# Patient Record
Sex: Female | Born: 1960 | Race: Asian | Hispanic: No | Marital: Married | Smoking: Never smoker
Health system: Southern US, Community
[De-identification: ages and names within clinical notes are randomized; demographics above are authoritative.]

## PROBLEM LIST (undated history)

## (undated) DIAGNOSIS — D219 Benign neoplasm of connective and other soft tissue, unspecified: Secondary | ICD-10-CM

## (undated) DIAGNOSIS — B181 Chronic viral hepatitis B without delta-agent: Secondary | ICD-10-CM

## (undated) DIAGNOSIS — I499 Cardiac arrhythmia, unspecified: Secondary | ICD-10-CM

## (undated) HISTORY — DX: Cardiac arrhythmia, unspecified: I49.9

## (undated) HISTORY — DX: Chronic viral hepatitis B without delta-agent: B18.1

## (undated) HISTORY — DX: Benign neoplasm of connective and other soft tissue, unspecified: D21.9

---

## 1997-05-24 ENCOUNTER — Other Ambulatory Visit: Admission: RE | Admit: 1997-05-24 | Discharge: 1997-05-24 | Payer: Self-pay | Admitting: Gynecology

## 1998-01-06 HISTORY — PX: BREAST LUMPECTOMY: SHX2

## 1998-06-05 ENCOUNTER — Other Ambulatory Visit: Admission: RE | Admit: 1998-06-05 | Discharge: 1998-06-05 | Payer: Self-pay | Admitting: Gynecology

## 1999-06-05 ENCOUNTER — Other Ambulatory Visit: Admission: RE | Admit: 1999-06-05 | Discharge: 1999-06-05 | Payer: Self-pay | Admitting: Gynecology

## 2000-06-08 ENCOUNTER — Other Ambulatory Visit: Admission: RE | Admit: 2000-06-08 | Discharge: 2000-06-08 | Payer: Self-pay | Admitting: Gynecology

## 2000-07-17 ENCOUNTER — Ambulatory Visit (HOSPITAL_COMMUNITY): Admission: RE | Admit: 2000-07-17 | Discharge: 2000-07-17 | Payer: Self-pay | Admitting: Gastroenterology

## 2000-07-17 ENCOUNTER — Encounter: Payer: Self-pay | Admitting: Gastroenterology

## 2000-12-17 ENCOUNTER — Other Ambulatory Visit: Admission: RE | Admit: 2000-12-17 | Discharge: 2000-12-17 | Payer: Self-pay | Admitting: Radiology

## 2001-01-06 HISTORY — PX: BREAST EXCISIONAL BIOPSY: SUR124

## 2001-06-14 ENCOUNTER — Other Ambulatory Visit: Admission: RE | Admit: 2001-06-14 | Discharge: 2001-06-14 | Payer: Self-pay | Admitting: Gynecology

## 2002-06-16 ENCOUNTER — Other Ambulatory Visit: Admission: RE | Admit: 2002-06-16 | Discharge: 2002-06-16 | Payer: Self-pay | Admitting: Gynecology

## 2002-07-15 ENCOUNTER — Encounter: Payer: Self-pay | Admitting: Gastroenterology

## 2002-07-15 ENCOUNTER — Encounter: Admission: RE | Admit: 2002-07-15 | Discharge: 2002-07-15 | Payer: Self-pay | Admitting: Gastroenterology

## 2003-06-27 ENCOUNTER — Other Ambulatory Visit: Admission: RE | Admit: 2003-06-27 | Discharge: 2003-06-27 | Payer: Self-pay | Admitting: Gynecology

## 2004-02-23 ENCOUNTER — Ambulatory Visit: Payer: Self-pay | Admitting: Internal Medicine

## 2004-05-02 ENCOUNTER — Ambulatory Visit: Payer: Self-pay | Admitting: Internal Medicine

## 2004-06-04 ENCOUNTER — Ambulatory Visit: Payer: Self-pay | Admitting: Internal Medicine

## 2004-08-06 ENCOUNTER — Other Ambulatory Visit: Admission: RE | Admit: 2004-08-06 | Discharge: 2004-08-06 | Payer: Self-pay | Admitting: Gynecology

## 2004-09-18 ENCOUNTER — Ambulatory Visit: Payer: Self-pay | Admitting: Internal Medicine

## 2004-11-07 ENCOUNTER — Ambulatory Visit: Payer: Self-pay | Admitting: Internal Medicine

## 2005-01-27 ENCOUNTER — Ambulatory Visit: Payer: Self-pay | Admitting: Internal Medicine

## 2005-08-07 ENCOUNTER — Other Ambulatory Visit: Admission: RE | Admit: 2005-08-07 | Discharge: 2005-08-07 | Payer: Self-pay | Admitting: Gynecology

## 2005-12-03 ENCOUNTER — Ambulatory Visit: Payer: Self-pay | Admitting: Internal Medicine

## 2005-12-03 LAB — CONVERTED CEMR LAB
AST: 17 units/L (ref 0–37)
Albumin: 4.5 g/dL (ref 3.5–5.2)
Alkaline Phosphatase: 36 units/L — ABNORMAL LOW (ref 39–117)
BUN: 11 mg/dL (ref 6–23)
Basophils Absolute: 0 10*3/uL (ref 0.0–0.1)
Cortisol, Plasma: 10.2 ug/dL
Creatinine, Ser: 0.8 mg/dL (ref 0.4–1.2)
Eosinophil percent: 1.8 % (ref 0.0–5.0)
GFR calc non Af Amer: 82 mL/min
HCT: 45.4 % (ref 36.0–46.0)
MCHC: 34.4 g/dL (ref 30.0–36.0)
Monocytes Relative: 10.7 % (ref 3.0–11.0)
Neutro Abs: 2.6 10*3/uL (ref 1.4–7.7)
Potassium: 3.8 meq/L (ref 3.5–5.1)
RBC: 4.85 M/uL (ref 3.87–5.11)
RDW: 12.4 % (ref 11.5–14.6)
Sodium: 139 meq/L (ref 135–145)
Total Bilirubin: 1.9 mg/dL — ABNORMAL HIGH (ref 0.3–1.2)
Total Protein: 7.4 g/dL (ref 6.0–8.3)
Vitamin B-12: 310 pg/mL (ref 211–911)

## 2006-02-24 ENCOUNTER — Ambulatory Visit: Payer: Self-pay | Admitting: Internal Medicine

## 2006-06-02 ENCOUNTER — Ambulatory Visit: Payer: Self-pay | Admitting: Internal Medicine

## 2006-06-02 LAB — CONVERTED CEMR LAB: Vit D, 1,25-Dihydroxy: 17 — ABNORMAL LOW (ref 20–57)

## 2006-06-10 ENCOUNTER — Ambulatory Visit: Payer: Self-pay | Admitting: Internal Medicine

## 2006-06-29 ENCOUNTER — Ambulatory Visit: Payer: Self-pay | Admitting: Internal Medicine

## 2006-07-02 ENCOUNTER — Ambulatory Visit: Payer: Self-pay | Admitting: Internal Medicine

## 2006-08-10 ENCOUNTER — Other Ambulatory Visit: Admission: RE | Admit: 2006-08-10 | Discharge: 2006-08-10 | Payer: Self-pay | Admitting: Gynecology

## 2006-08-10 ENCOUNTER — Ambulatory Visit: Payer: Self-pay | Admitting: Internal Medicine

## 2006-10-06 ENCOUNTER — Ambulatory Visit: Payer: Self-pay | Admitting: Internal Medicine

## 2007-03-17 ENCOUNTER — Other Ambulatory Visit: Admission: RE | Admit: 2007-03-17 | Discharge: 2007-03-17 | Payer: Self-pay | Admitting: Gynecology

## 2007-05-20 ENCOUNTER — Ambulatory Visit: Payer: Self-pay | Admitting: Internal Medicine

## 2007-05-20 DIAGNOSIS — E559 Vitamin D deficiency, unspecified: Secondary | ICD-10-CM | POA: Insufficient documentation

## 2007-05-20 DIAGNOSIS — G4700 Insomnia, unspecified: Secondary | ICD-10-CM | POA: Insufficient documentation

## 2007-05-20 DIAGNOSIS — K649 Unspecified hemorrhoids: Secondary | ICD-10-CM | POA: Insufficient documentation

## 2007-05-20 DIAGNOSIS — N760 Acute vaginitis: Secondary | ICD-10-CM | POA: Insufficient documentation

## 2007-05-21 LAB — CONVERTED CEMR LAB

## 2007-08-11 ENCOUNTER — Other Ambulatory Visit: Admission: RE | Admit: 2007-08-11 | Discharge: 2007-08-11 | Payer: Self-pay | Admitting: Gynecology

## 2008-03-06 ENCOUNTER — Ambulatory Visit: Payer: Self-pay | Admitting: Internal Medicine

## 2008-03-06 DIAGNOSIS — R209 Unspecified disturbances of skin sensation: Secondary | ICD-10-CM | POA: Insufficient documentation

## 2008-03-06 DIAGNOSIS — M269 Dentofacial anomaly, unspecified: Secondary | ICD-10-CM | POA: Insufficient documentation

## 2008-03-06 DIAGNOSIS — E538 Deficiency of other specified B group vitamins: Secondary | ICD-10-CM | POA: Insufficient documentation

## 2008-03-06 DIAGNOSIS — R252 Cramp and spasm: Secondary | ICD-10-CM | POA: Insufficient documentation

## 2008-03-06 DIAGNOSIS — R002 Palpitations: Secondary | ICD-10-CM | POA: Insufficient documentation

## 2008-03-06 DIAGNOSIS — R42 Dizziness and giddiness: Secondary | ICD-10-CM | POA: Insufficient documentation

## 2008-03-06 DIAGNOSIS — J45909 Unspecified asthma, uncomplicated: Secondary | ICD-10-CM | POA: Insufficient documentation

## 2008-03-13 ENCOUNTER — Ambulatory Visit: Payer: Self-pay | Admitting: Internal Medicine

## 2008-03-22 ENCOUNTER — Encounter: Payer: Self-pay | Admitting: Internal Medicine

## 2008-03-22 ENCOUNTER — Ambulatory Visit: Payer: Self-pay

## 2008-04-05 ENCOUNTER — Ambulatory Visit: Payer: Self-pay | Admitting: Internal Medicine

## 2008-04-28 ENCOUNTER — Ambulatory Visit: Payer: Self-pay | Admitting: Internal Medicine

## 2008-04-28 DIAGNOSIS — M549 Dorsalgia, unspecified: Secondary | ICD-10-CM | POA: Insufficient documentation

## 2008-04-28 DIAGNOSIS — M545 Low back pain, unspecified: Secondary | ICD-10-CM | POA: Insufficient documentation

## 2008-06-30 ENCOUNTER — Ambulatory Visit: Payer: Self-pay | Admitting: Internal Medicine

## 2008-06-30 LAB — CONVERTED CEMR LAB
BUN: 9 mg/dL (ref 6–23)
Calcium: 9.1 mg/dL (ref 8.4–10.5)
Creatinine, Ser: 0.6 mg/dL (ref 0.4–1.2)
GFR calc non Af Amer: 112.64 mL/min (ref >60)
Glucose, Bld: 117 mg/dL — ABNORMAL HIGH (ref 70–99)
Vit D, 25-Hydroxy: 33 ng/mL (ref 30–89)

## 2008-07-06 ENCOUNTER — Ambulatory Visit: Payer: Self-pay | Admitting: Internal Medicine

## 2008-08-15 ENCOUNTER — Encounter: Payer: Self-pay | Admitting: Gynecology

## 2008-08-15 ENCOUNTER — Ambulatory Visit: Payer: Self-pay | Admitting: Gynecology

## 2008-08-15 ENCOUNTER — Other Ambulatory Visit: Admission: RE | Admit: 2008-08-15 | Discharge: 2008-08-15 | Payer: Self-pay | Admitting: Gynecology

## 2008-09-27 ENCOUNTER — Encounter: Payer: Self-pay | Admitting: Internal Medicine

## 2009-02-15 ENCOUNTER — Encounter: Payer: Self-pay | Admitting: Internal Medicine

## 2009-03-07 ENCOUNTER — Encounter: Payer: Self-pay | Admitting: Internal Medicine

## 2009-03-27 ENCOUNTER — Ambulatory Visit: Payer: Self-pay | Admitting: Internal Medicine

## 2009-03-27 DIAGNOSIS — M25539 Pain in unspecified wrist: Secondary | ICD-10-CM | POA: Insufficient documentation

## 2009-03-27 DIAGNOSIS — R635 Abnormal weight gain: Secondary | ICD-10-CM | POA: Insufficient documentation

## 2009-03-29 LAB — CONVERTED CEMR LAB
BUN: 14 mg/dL (ref 6–23)
Basophils Absolute: 0 10*3/uL (ref 0.0–0.1)
CO2: 28 meq/L (ref 19–32)
Calcium: 8.9 mg/dL (ref 8.4–10.5)
Chloride: 106 meq/L (ref 96–112)
Creatinine, Ser: 0.8 mg/dL (ref 0.4–1.2)
Glucose, Bld: 95 mg/dL (ref 70–99)
HCT: 41.8 % (ref 36.0–46.0)
Hemoglobin: 14.2 g/dL (ref 12.0–15.0)
Lymphs Abs: 1.8 10*3/uL (ref 0.7–4.0)
MCV: 95.4 fL (ref 78.0–100.0)
Monocytes Absolute: 0.5 10*3/uL (ref 0.1–1.0)
Monocytes Relative: 7.8 % (ref 3.0–12.0)
Neutro Abs: 4.2 10*3/uL (ref 1.4–7.7)
Platelets: 252 10*3/uL (ref 150.0–400.0)
RDW: 12.2 % (ref 11.5–14.6)
Vitamin B-12: 217 pg/mL (ref 211–911)

## 2009-05-15 ENCOUNTER — Encounter: Admission: RE | Admit: 2009-05-15 | Discharge: 2009-05-15 | Payer: Self-pay | Admitting: Internal Medicine

## 2009-08-27 ENCOUNTER — Encounter: Payer: Self-pay | Admitting: Internal Medicine

## 2009-08-28 ENCOUNTER — Other Ambulatory Visit: Admission: RE | Admit: 2009-08-28 | Discharge: 2009-08-28 | Payer: Self-pay | Admitting: Gynecology

## 2009-08-28 ENCOUNTER — Ambulatory Visit: Payer: Self-pay | Admitting: Gynecology

## 2009-10-11 ENCOUNTER — Ambulatory Visit: Payer: Self-pay | Admitting: Internal Medicine

## 2009-10-11 DIAGNOSIS — J069 Acute upper respiratory infection, unspecified: Secondary | ICD-10-CM | POA: Insufficient documentation

## 2010-01-31 ENCOUNTER — Other Ambulatory Visit: Payer: Self-pay | Admitting: Internal Medicine

## 2010-01-31 ENCOUNTER — Ambulatory Visit
Admission: RE | Admit: 2010-01-31 | Discharge: 2010-01-31 | Payer: Self-pay | Source: Home / Self Care | Attending: Internal Medicine | Admitting: Internal Medicine

## 2010-01-31 LAB — CBC WITH DIFFERENTIAL/PLATELET
Basophils Absolute: 0 10*3/uL (ref 0.0–0.1)
Basophils Relative: 0.5 % (ref 0.0–3.0)
Eosinophils Absolute: 0.3 10*3/uL (ref 0.0–0.7)
Eosinophils Relative: 5.3 % — ABNORMAL HIGH (ref 0.0–5.0)
HCT: 41.9 % (ref 36.0–46.0)
Hemoglobin: 14.6 g/dL (ref 12.0–15.0)
Lymphocytes Relative: 39 % (ref 12.0–46.0)
Lymphs Abs: 2.5 10*3/uL (ref 0.7–4.0)
MCHC: 34.9 g/dL (ref 30.0–36.0)
MCV: 94.2 fl (ref 78.0–100.0)
Monocytes Absolute: 0.5 10*3/uL (ref 0.1–1.0)
Monocytes Relative: 8.6 % (ref 3.0–12.0)
Neutro Abs: 3 10*3/uL (ref 1.4–7.7)
Neutrophils Relative %: 46.6 % (ref 43.0–77.0)
Platelets: 295 10*3/uL (ref 150.0–400.0)
RBC: 4.45 Mil/uL (ref 3.87–5.11)
RDW: 13.4 % (ref 11.5–14.6)
WBC: 6.4 10*3/uL (ref 4.5–10.5)

## 2010-01-31 LAB — URINALYSIS, ROUTINE W REFLEX MICROSCOPIC
Bilirubin Urine: NEGATIVE
Ketones, ur: NEGATIVE
Leukocytes, UA: NEGATIVE
Nitrite: NEGATIVE
Specific Gravity, Urine: 1.01 (ref 1.000–1.030)
Total Protein, Urine: NEGATIVE
Urine Glucose: NEGATIVE
Urobilinogen, UA: 0.2 (ref 0.0–1.0)
pH: 6.5 (ref 5.0–8.0)

## 2010-01-31 LAB — BASIC METABOLIC PANEL
BUN: 15 mg/dL (ref 6–23)
CO2: 27 mEq/L (ref 19–32)
Calcium: 9.1 mg/dL (ref 8.4–10.5)
Chloride: 100 mEq/L (ref 96–112)
Creatinine, Ser: 0.6 mg/dL (ref 0.4–1.2)
GFR: 111.92 mL/min (ref >60.00)
Glucose, Bld: 94 mg/dL (ref 70–99)
Potassium: 3.7 mEq/L (ref 3.5–5.1)
Sodium: 135 mEq/L (ref 135–145)

## 2010-01-31 LAB — HEPATIC FUNCTION PANEL
ALT: 22 U/L (ref 0–35)
AST: 16 U/L (ref 0–37)
Albumin: 4 g/dL (ref 3.5–5.2)
Alkaline Phosphatase: 38 U/L — ABNORMAL LOW (ref 39–117)
Bilirubin, Direct: 0.1 mg/dL (ref 0.0–0.3)
Total Bilirubin: 1.2 mg/dL (ref 0.3–1.2)
Total Protein: 7.3 g/dL (ref 6.0–8.3)

## 2010-01-31 LAB — LIPID PANEL
Cholesterol: 211 mg/dL — ABNORMAL HIGH (ref 0–200)
HDL: 60.7 mg/dL (ref >39.00)
Total CHOL/HDL Ratio: 3
Triglycerides: 132 mg/dL (ref 0.0–149.0)
VLDL: 26.4 mg/dL (ref 0.0–40.0)

## 2010-01-31 LAB — TSH: TSH: 1.89 u[IU]/mL (ref 0.35–5.50)

## 2010-01-31 LAB — LDL CHOLESTEROL, DIRECT: Direct LDL: 136.6 mg/dL

## 2010-02-03 LAB — CONVERTED CEMR LAB
Albumin: 4.2 g/dL (ref 3.5–5.2)
BUN: 11 mg/dL (ref 6–23)
Basophils Absolute: 0 10*3/uL (ref 0.0–0.1)
Bilirubin Urine: NEGATIVE
Bilirubin, Direct: 0.2 mg/dL (ref 0.0–0.3)
Calcium: 9.2 mg/dL (ref 8.4–10.5)
GFR calc Af Amer: 137 mL/min
Glucose, Bld: 99 mg/dL (ref 70–99)
Leukocytes, UA: NEGATIVE
Lymphocytes Relative: 36.6 % (ref 12.0–46.0)
MCHC: 34.8 g/dL (ref 30.0–36.0)
Magnesium: 2.6 mg/dL — ABNORMAL HIGH (ref 1.5–2.5)
Mucus, UA: NEGATIVE
Neutrophils Relative %: 53.6 % (ref 43.0–77.0)
Nitrite: NEGATIVE
Platelets: 279 10*3/uL (ref 150–400)
RDW: 12.3 % (ref 11.5–14.6)
Sed Rate: 10 mm/hr (ref 0–22)
Sodium: 141 meq/L (ref 135–145)
Specific Gravity, Urine: <=1.005 (ref 1.000–1.035)
TSH: 2.69 microintl units/mL (ref 0.35–5.50)
Total Protein: 7.3 g/dL (ref 6.0–8.3)
pH: 6 (ref 5.0–8.0)

## 2010-02-06 ENCOUNTER — Ambulatory Visit: Admit: 2010-02-06 | Payer: Self-pay | Admitting: Internal Medicine

## 2010-02-06 ENCOUNTER — Encounter: Payer: Self-pay | Admitting: Internal Medicine

## 2010-02-06 ENCOUNTER — Other Ambulatory Visit: Payer: Self-pay | Admitting: Internal Medicine

## 2010-02-06 ENCOUNTER — Encounter (INDEPENDENT_AMBULATORY_CARE_PROVIDER_SITE_OTHER): Payer: BC Managed Care – PPO | Admitting: Internal Medicine

## 2010-02-06 DIAGNOSIS — E559 Vitamin D deficiency, unspecified: Secondary | ICD-10-CM

## 2010-02-06 DIAGNOSIS — R21 Rash and other nonspecific skin eruption: Secondary | ICD-10-CM | POA: Insufficient documentation

## 2010-02-06 DIAGNOSIS — D485 Neoplasm of uncertain behavior of skin: Secondary | ICD-10-CM

## 2010-02-06 DIAGNOSIS — E538 Deficiency of other specified B group vitamins: Secondary | ICD-10-CM

## 2010-02-06 DIAGNOSIS — K5900 Constipation, unspecified: Secondary | ICD-10-CM | POA: Insufficient documentation

## 2010-02-06 DIAGNOSIS — K5909 Other constipation: Secondary | ICD-10-CM

## 2010-02-07 NOTE — Consult Note (Signed)
Summary: [facility] [place]   Imported By: Sherian Rein 09/04/2009 13:36:00  _____________________________________________________________________  External Attachment:    Type:   Image     Comment:   External Document

## 2010-02-07 NOTE — Miscellaneous (Signed)
[facility]   Imported By: Lester Butler 04/05/2009 11:58:21  _____________________________________________________________________  External Attachment:    Type:   Image     Comment:   External Document

## 2010-02-07 NOTE — Procedures (Signed)
Summary: Colonoscopy/[facility]  Colonoscopy/[facility]   Imported By: Sherian Rein 03/28/2009 07:55:37  _____________________________________________________________________  External Attachment:    Type:   Image     Comment:   External Document

## 2010-02-07 NOTE — Assessment & Plan Note (Signed)
Summary: REFILLS/ WRIST PAIN/NWS   Vital Signs:  Patient profile:   50 year old female Weight:      126 pounds BMI:     23.89 Temp:     97.8 degrees F oral Pulse rate:   84 / minute BP sitting:   100 / 70  (left arm)  Vitals Entered By: Tora Perches (March 27, 2009 1:47 PM) CC: pt c/o of  right wrist pain and refills/vg Is Patient Diabetic? No   Primary Care Provider:  Georgina Quint Plotnikov MD  CC:  pt c/o of  right wrist pain and refills/vg.  History of Present Illness: The patient presents for a follow up of back anxiety, constipation and headaches. C/o wt gain. C/o R bad wrist pain x 2 wks after moving luggage C/o wt gain 10 lbs C/o a lot of stress C/o nausea - gets plane sick   Preventive Screening-Counseling & Management  Alcohol-Tobacco     Smoking Status: never  Current Medications (verified): 1)  Lutera 0.1-20 Mg-Mcg  Tabs (Levonorgestrel-Ethinyl Estrad) .... Take 1 Tablet By Mouth Once A Day 2)  Miralax   Powd (Polyethylene Glycol 3350) .Marland Kitchen.. 17g By Mouth Once Daily As Needed Constipation 3)  Lorazepam 0.5 Mg Tabs (Lorazepam) .Marland Kitchen.. 1 By Mouth Two Times A Day As Needed Anxiety/insomnia 4)  Promethazine Hcl 25 Mg  Tabs (Promethazine Hcl) .Marland Kitchen.. 1 By Mouth Qid As Needed Nausea 5)  Meclizine Hcl 12.5 Mg Tabs (Meclizine Hcl) .Marland Kitchen.. 1 By Mouth Qid As Needed Vertigo 6)  Vitamin D3 1000 Unit  Tabs (Cholecalciferol) .... 2 By Mouth Daily 7)  Advair Diskus 100-50 Mcg/dose Misc (Fluticasone-Salmeterol) .Marland Kitchen.. 1 Puff 2 Times Daily 8)  Valtrex 500 Mg Tabs (Valacyclovir Hcl) .Marland Kitchen.. 1 By Mouth Two Times A Day As Needed Cold Sores X 5 D 9)  Vitamin B-12 Cr 1000 Mcg  Tbcr (Cyanocobalamin) .... Take One Tablet By Mouth Daily 10)  Propranolol Hcl 10 Mg Tabs (Propranolol Hcl) .Marland Kitchen.. 1 By Mouth Two Times A Day As Needed Palpitations 11)  Potassium Chloride Cr 10 Meq  Tbcr (Potassium Chloride) .Marland Kitchen.. 1 By Mouth Once Daily 12)  Triamcinolone Acetonide 0.5 % Crea (Triamcinolone Acetonide) .... Apply  Bid To Affected Area 13)  Align  Caps (Probiotic Product) .... Once Daily 14)  Stool Softener 100 Mg Caps (Docusate Sodium) .... Once Daily  Allergies: 1)  ! * Alcohol 2)  ! Avelox 3)  ! Doxycycline 4)  ! Sulfa 5)  ! Cipro  Past History:  Past Medical History: Vit D def Insomnia BPV Asthma L TMJ 2010  Cold sores 2010 Palpitations 2010 Low vit B12 2010 MVP on ECHO 2010 GI Dr [address] Colon 2011  Past Surgical History: Denies surgical history  Social History: Married, 1 son Kenard Gower Husband had a brain tumor which reccured in 2010 Never Smoked Regular exercise-no  Review of Systems       The patient complains of weight gain and depression.  The patient denies chest pain, dyspnea on exertion, prolonged cough, and abdominal pain.         constipation, fatigue  Physical Exam  General:  NADwell-developed and well-nourished.   Head:  Normocephalic and atraumatic without obvious abnormalities. No apparent alopecia or balding. Eyes:  No corneal or conjunctival inflammation noted. EOMI. Perrla Ears:  External ear exam shows no significant lesions or deformities.  Otoscopic examination reveals clear canals, tympanic membranes are intact bilaterally without bulging, retraction, inflammation or discharge. Hearing is grossly normal bilaterally. Nose:  External  nasal examination shows no deformity or inflammation. Nasal mucosa are pink and moist without lesions or exudates. Mouth:  Oral mucosa and oropharynx without lesions or exudates.  Teeth in good repair. Neck:  No deformities, masses, or tenderness noted. Lungs:  Normal respiratory effort, chest expands symmetrically. Lungs are clear to auscultation, no crackles or wheezes. Heart:  Normal rate and regular rhythm. S1 and S2 normal without gallop, murmur, click, rub or other extra sounds. Abdomen:  Bowel sounds positive,abdomen soft and non-tender without masses, organomegaly or hernias noted. Msk:  R lat wrist is painful Pulses:  R  and L carotid,radial,femoral,dorsalis pedis and posterior tibial pulses are full and equal bilaterally Extremities:  No clubbing, cyanosis, edema, or deformity noted with normal full range of motion of all joints.   Neurologic:  No cranial nerve deficits noted. Station and gait are normal. Plantar reflexes are down-going bilaterally. DTRs are symmetrical throughout. Sensory, motor and coordinative functions appear intact. Skin:  Intact without suspicious lesions or rashes Cervical Nodes:  No lymphadenopathy noted Inguinal Nodes:  No significant adenopathy Psych:  Oriented X3, memory intact for recent and remote, normally interactive, good eye contact, not anxious appearing, and not depressed appearing.     Impression & Recommendations:  Problem # 1:  B12 DEFICIENCY (ICD-266.2) Assessment Unchanged  Orders: TLB-BMP (Basic Metabolic Panel-BMET) (80048-METABOL) TLB-TSH (Thyroid Stimulating Hormone) (84443-TSH) TLB-B12, Serum-Total ONLY (44034-V42) TLB-CBC Platelet - w/Differential (85025-CBCD)  Problem # 2:  WEIGHT GAIN, ABNORMAL (ICD-783.1) - I don't see it as a problem Assessment: New  Orders: TLB-BMP (Basic Metabolic Panel-BMET) (80048-METABOL) TLB-TSH (Thyroid Stimulating Hormone) (84443-TSH) TLB-B12, Serum-Total ONLY (59563-O75) TLB-CBC Platelet - w/Differential (85025-CBCD) The office visit took longer than 45 min with patient councelling for more than 50% of the 45 min  Problem # 3:  INSOMNIA, PERSISTENT (ICD-307.42)  Problem # 4:  CRAMPS,LEG (ICD-729.82) Assessment: Unchanged  Orders: TLB-BMP (Basic Metabolic Panel-BMET) (80048-METABOL) TLB-TSH (Thyroid Stimulating Hormone) (84443-TSH) TLB-B12, Serum-Total ONLY (64332-R51) TLB-CBC Platelet - w/Differential (85025-CBCD)  Problem # 5:  WRIST PAIN (ICD-719.43) R  Complete Medication List: 1)  Lutera 0.1-20 Mg-mcg Tabs (Levonorgestrel-ethinyl estrad) .... Take 1 tablet by mouth once a day 2)  Miralax Powd (Polyethylene  glycol 3350) .Marland Kitchen.. 17g by mouth once daily as needed constipation 3)  Promethazine Hcl 25 Mg Tabs (Promethazine hcl) .Marland Kitchen.. 1 by mouth qid as needed nausea 4)  Meclizine Hcl 12.5 Mg Tabs (Meclizine hcl) .Marland Kitchen.. 1 by mouth qid as needed vertigo 5)  Vitamin D3 1000 Unit Tabs (Cholecalciferol) .... 2 by mouth daily 6)  Advair Diskus 100-50 Mcg/dose Misc (Fluticasone-salmeterol) .Marland Kitchen.. 1 puff 2 times daily 7)  Valtrex 500 Mg Tabs (Valacyclovir hcl) .Marland Kitchen.. 1 by mouth two times a day as needed cold sores x 5 d 8)  Propranolol Hcl 10 Mg Tabs (Propranolol hcl) .Marland Kitchen.. 1 by mouth two times a day as needed palpitations 9)  Potassium Chloride Cr 10 Meq Tbcr (Potassium chloride) .Marland Kitchen.. 1 by mouth once daily 10)  Triamcinolone Acetonide 0.5 % Crea (Triamcinolone acetonide) .... Apply bid to affected area 11)  Align Caps (Probiotic product) .... Once daily 12)  Stool Softener 100 Mg Caps (Docusate sodium) .... Once daily 13)  Zolpidem Tartrate 5 Mg Tabs (Zolpidem tartrate) .Marland Kitchen.. 1-2 by mouth at bedtime as needed insomnia 14)  Amitiza 24 Mcg Caps (Lubiprostone) .Marland Kitchen.. 1-2  by mouth once daily as needed constipation 15)  Zithromax Z-pak 250 Mg Tabs (Azithromycin) .... As dirrected 16)  Pennsaid 1.5 % Soln (Diclofenac sodium) .... 3-5 gtt  on skin three times a day for pain 17)  Nasacort Aq 55 Mcg/act Aers (Triamcinolone acetonide(nasal)) .Marland Kitchen.. 1 spray in each nostril daily for rhinitis 18)  Nascobal 500 Mcg/0.80ml Soln (Cyanocobalamin) .Marland Kitchen.. 1 spr in 1 nostril q 1 wk for vit b12 deficiency  Patient Instructions: 1)  Please schedule a follow-up appointment in 4 months. Prescriptions: NASCOBAL 500 MCG/0.1ML SOLN (CYANOCOBALAMIN) 1 spr in 1 nostril q 1 wk for Vit B12 deficiency  #1 x 0   Entered and Authorized by:   Tresa Garter MD   Signed by:   Tresa Garter MD on 03/29/2009   Method used:   Print then Give to Patient   RxID:   [id number] NASACORT AQ 55 MCG/ACT AERS (TRIAMCINOLONE ACETONIDE(NASAL)) 1 spray in  each nostril daily for rhinitis  #1 x 12   Entered and Authorized by:   Tresa Garter MD   Signed by:   Tresa Garter MD on 03/29/2009   Method used:   Print then Give to Patient   RxID:   [id number] PENNSAID 1.5 % SOLN (DICLOFENAC SODIUM) 3-5 gtt on skin three times a day for pain  #1 x 3   Entered and Authorized by:   Tresa Garter MD   Signed by:   Tresa Garter MD on 03/27/2009   Method used:   Print then Give to Patient   RxID:   [id number] ZITHROMAX Z-PAK 250 MG TABS (AZITHROMYCIN) as dirrected  #1 x 0   Entered and Authorized by:   Tresa Garter MD   Signed by:   Tresa Garter MD on 03/27/2009   Method used:   Print then Give to Patient   RxID:   [id number] TRIAMCINOLONE ACETONIDE 0.5 % CREA (TRIAMCINOLONE ACETONIDE) apply bid to affected area  #120 g x 3   Entered and Authorized by:   Tresa Garter MD   Signed by:   Tresa Garter MD on 03/27/2009   Method used:   Print then Give to Patient   RxID:   [id number] VALTREX 500 MG TABS (VALACYCLOVIR HCL) 1 by mouth two times a day as needed cold sores x 5 d  #20 x 3   Entered and Authorized by:   Tresa Garter MD   Signed by:   Tresa Garter MD on 03/27/2009   Method used:   Print then Give to Patient   RxID:   [id number] PROMETHAZINE HCL 25 MG  TABS (PROMETHAZINE HCL) 1 by mouth qid as needed nausea  #60 x 3   Entered and Authorized by:   Tresa Garter MD   Signed by:   Tresa Garter MD on 03/27/2009   Method used:   Print then Give to Patient   RxID:   [id number] AMITIZA 24 MCG CAPS (LUBIPROSTONE) 1-2  by mouth once daily as needed constipation  #60 x 12   Entered and Authorized by:   Tresa Garter MD   Signed by:   Tresa Garter MD on 03/27/2009   Method used:   Print then Give to Patient   RxID:   [id number] ZOLPIDEM TARTRATE 5 MG TABS (ZOLPIDEM TARTRATE) 1-2 by mouth at bedtime as needed insomnia  #60  x 6   Entered and Authorized by:   Tresa Garter MD   Signed by:   Tresa Garter MD on 03/27/2009   Method used:   Print then Give to Patient   RxID:   [id number]

## 2010-02-07 NOTE — Assessment & Plan Note (Signed)
Summary: spinal adj/dt   Vital Signs:  Patient profile:   50 year old female Height:      61 inches Weight:      125 pounds BMI:     23.70 O2 Sat:      100 % on Room air Temp:     97.4 degrees F oral Pulse rate:   76 / minute Pulse rhythm:   regular Resp:     16 per minute BP sitting:   100 / 70  (right arm) Cuff size:   regular  Vitals Entered By: Glendell Docker CMA (October 11, 2009 3:01 PM)  O2 Flow:  Room air  Contraindications/Deferment of Procedures/Staging:    Test/Procedure: FLU VAX    Reason for deferment: patient declined  CC: OMT Is Patient Diabetic? No Pain Assessment Patient in pain? no      Comments c/o mild throat irritation, leaving for [place] on Monday for 8 days   Primary Care Provider:  Georgina Quint Plotnikov MD  CC:  OMT.  History of Present Illness: 50 y/o [place] female c/o chronic back pain still traveling overseas spends time also working on computer no severe symptoms  1 week - throat is irritated left sided sinus pressure  Preventive Screening-Counseling & Management  Alcohol-Tobacco     Smoking Status: never  Allergies: 1)  ! * Alcohol 2)  ! Avelox 3)  ! Doxycycline 4)  ! Sulfa 5)  ! Cipro  Past History:  Past Medical History: Vit D def Insomnia BPV  Asthma L TMJ 2010  Cold sores 2010 Palpitations 2010 Low vit B12 2010 MVP on ECHO 2010 GI Dr [address] Colon 2011  Past Surgical History: Denies surgical history    Family History: Brother with cerebral hemorrhage at 35 yo  No family hx of polycystic kidney dz   Social History: Married, 1 son Kenard Gower Husband had a brain tumor which reccured in 2010 Never Smoked Regular exercise-no    Review of Systems       some wt gain.  prev thyroid testing in March normal.  Physical Exam  General:  alert, well-developed, and well-nourished.   Lungs:  normal respiratory effort and normal breath sounds.   Heart:  normal rate, regular rhythm, and no gallop.   Msk:  increased  muscle tone in thoracic and c spine    Impression & Recommendations:  Problem # 1:  BACK PAIN, CHRONIC, INTERMITTENT (ICD-724.5)  woke up with upper back pain and neck stiffness.  she has been working on her computer more than usual utilized OMT to lumbar, thoracic and c spine.  she tolerated well use muscle relaxer as needed.  do not take with ambien  Her updated medication list for this problem includes:    Cyclobenzaprine Hcl 5 Mg Tabs (Cyclobenzaprine hcl) ..... One by mouth once daily as needed  Orders: OMT 1-2 [facility] [phone])  Problem # 2:  URI (ICD-465.9) probable viral cause. she will be traveling to [place] on business. we discussed symptoms of bacterial sinusitis use ceftin if needed  Her updated medication list for this problem includes:    Promethazine Hcl 25 Mg Tabs (Promethazine hcl) .Marland Kitchen... 1 by mouth qid as needed nausea  Complete Medication List: 1)  Lutera 0.1-20 Mg-mcg Tabs (Levonorgestrel-ethinyl estrad) .... Take 1 tablet by mouth once a day 2)  Miralax Powd (Polyethylene glycol 3350) .Marland Kitchen.. 17g by mouth once daily as needed constipation 3)  Promethazine Hcl 25 Mg Tabs (Promethazine hcl) .Marland Kitchen.. 1 by mouth qid  as needed nausea 4)  Meclizine Hcl 12.5 Mg Tabs (Meclizine hcl) .Marland Kitchen.. 1 by mouth qid as needed vertigo 5)  Vitamin D3 1000 Unit Tabs (Cholecalciferol) .... 2 by mouth daily 6)  Advair Diskus 100-50 Mcg/dose Misc (Fluticasone-salmeterol) .Marland Kitchen.. 1 puff 2 times daily 7)  Valtrex 500 Mg Tabs (Valacyclovir hcl) .Marland Kitchen.. 1 by mouth two times a day as needed cold sores x 5 d 8)  Propranolol Hcl 10 Mg Tabs (Propranolol hcl) .Marland Kitchen.. 1 by mouth two times a day as needed palpitations 9)  Potassium Chloride Cr 10 Meq Tbcr (Potassium chloride) .Marland Kitchen.. 1 by mouth once daily 10)  Triamcinolone Acetonide 0.5 % Crea (Triamcinolone acetonide) .... Apply bid to affected area 11)  Zolpidem Tartrate 5 Mg Tabs (Zolpidem tartrate) .Marland Kitchen.. 1-2 by mouth at bedtime as needed insomnia 12)   Cefuroxime Axetil 250 Mg Tabs (Cefuroxime axetil) .... One tab by mouth two times a day 13)  Cyclobenzaprine Hcl 5 Mg Tabs (Cyclobenzaprine hcl) .... One by mouth once daily as needed  Patient Instructions: 1)  Call our office if your symptoms do not  improve or gets worse. Prescriptions: CYCLOBENZAPRINE HCL 5 MG TABS (CYCLOBENZAPRINE HCL) one by mouth once daily as needed  #15 x 0   Entered and Authorized by:   D. Thomos Lemons DO   Signed by:   D. Thomos Lemons DO on 10/11/2009   Method used:   Electronically to        [id number] (retail)       [address]       [place], [place]  [address]       Ph: [phone] or [phone]       Fax: [phone]   RxID:   [id number] CEFUROXIME AXETIL 250 MG TABS (CEFUROXIME AXETIL) one tab by mouth two times a day  #14 x 0   Entered and Authorized by:   D. Thomos Lemons DO   Signed by:   D. Thomos Lemons DO on 10/11/2009   Method used:   Print then Give to Patient   RxID:   [id number]    Immunization History:  Tetanus/Td Immunization History:    Tetanus/Td:  historical (09/10/2004)  Influenza Immunization History:    Influenza:  declined (10/11/2009)    Preventive Care Screening  Last Flu Shot:    Date:  10/11/2009    Results:  Declined  Pap Smear:    Date:  08/20/2009    Results:  normal   Colonoscopy:    Date:  03/20/2009    Results:  normal   Mammogram:    Date:  02/20/2009    Results:  normal   Last Tetanus Booster:    Date:  09/10/2004    Results:  Historical    Current Allergies (reviewed today): ! * ALCOHOL ! AVELOX ! DOXYCYCLINE ! SULFA ! CIPRO

## 2010-02-13 NOTE — Assessment & Plan Note (Signed)
Summary: CPX  BCBS  #  --CD   Vital Signs:  Patient profile:   50 year old female Height:      61 inches Weight:      128 pounds BMI:     24.27 Temp:     97.6 degrees F oral Pulse rate:   72 / minute Pulse rhythm:   regular Resp:     16 per minute BP sitting:   112 / 80  (left arm) Cuff size:   regular  Vitals Entered By: Lanier Prude, CMA(AAMA) (February 06, 2010 10:55 AM) CC: CPX Is Patient Diabetic? No Comments pt needs referral for cyst/tag on eyelid. She is not taking meclizine, pot chloride, cefuroxime or cyclobenazeprine   Primary Care Provider:  Tresa Garter MD  CC:  CPX.  History of Present Illness: C/o lump on L eyelid, it is bothering her C/o fatigue C/o constipation C/o rash on B arms x 2 d  Current Medications (verified): 1)  Lutera 0.1-20 Mg-Mcg  Tabs (Levonorgestrel-Ethinyl Estrad) .... Take 1 Tablet By Mouth Once A Day 2)  Miralax   Powd (Polyethylene Glycol 3350) .Marland Kitchen.. 17g By Mouth Once Daily As Needed Constipation 3)  Promethazine Hcl 25 Mg  Tabs (Promethazine Hcl) .Marland Kitchen.. 1 By Mouth Qid As Needed Nausea 4)  Meclizine Hcl 12.5 Mg Tabs (Meclizine Hcl) .Marland Kitchen.. 1 By Mouth Qid As Needed Vertigo 5)  Vitamin D3 1000 Unit  Tabs (Cholecalciferol) .... 2 By Mouth Daily 6)  Advair Diskus 100-50 Mcg/dose Misc (Fluticasone-Salmeterol) .Marland Kitchen.. 1 Puff 2 Times Daily 7)  Valtrex 500 Mg Tabs (Valacyclovir Hcl) .Marland Kitchen.. 1 By Mouth Two Times A Day As Needed Cold Sores X 5 D 8)  Propranolol Hcl 10 Mg Tabs (Propranolol Hcl) .Marland Kitchen.. 1 By Mouth Two Times A Day As Needed Palpitations 9)  Potassium Chloride Cr 10 Meq  Tbcr (Potassium Chloride) .Marland Kitchen.. 1 By Mouth Once Daily 10)  Triamcinolone Acetonide 0.5 % Crea (Triamcinolone Acetonide) .... Apply Bid To Affected Area 11)  Zolpidem Tartrate 5 Mg Tabs (Zolpidem Tartrate) .Marland Kitchen.. 1-2 By Mouth At Bedtime As Needed Insomnia 12)  Cefuroxime Axetil 250 Mg Tabs (Cefuroxime Axetil) .... One Tab By Mouth Two Times A Day 13)  Cyclobenzaprine Hcl 5 Mg  Tabs (Cyclobenzaprine Hcl) .... One By Mouth Once Daily As Needed  Allergies (verified): 1)  ! * Alcohol 2)  ! Avelox 3)  ! Doxycycline 4)  ! Sulfa 5)  ! Cipro  Past History:  Past Medical History: Last updated: 10/11/2009 Vit D def Insomnia BPV  Asthma L TMJ 2010  Cold sores 2010 Palpitations 2010 Low vit B12 2010 MVP on ECHO 2010 GI Dr [address] Colon 2011  Social History: Last updated: 10/11/2009 Married, 1 son Kenard Gower Husband had a brain tumor which reccured in 2010 Never Smoked Regular exercise-no    Review of Systems       The patient complains of weight gain.  The patient denies fever, chest pain, dyspnea on exertion, abdominal pain, and depression.         stress  Physical Exam  General:  alert, well-developed, and well-nourished.   Head:  Normocephalic and atraumatic without obvious abnormalities. No apparent alopecia or balding. Eyes:  4 mm growth on R upper eyelid Ears:  External ear exam shows no significant lesions or deformities.  Otoscopic examination reveals clear canals, tympanic membranes are intact bilaterally without bulging, retraction, inflammation or discharge. Hearing is grossly normal bilaterally. Nose:  External nasal examination shows no deformity or inflammation.  Nasal mucosa are pink and moist without lesions or exudates. Mouth:  Oral mucosa and oropharynx without lesions or exudates.  Teeth in good repair. Lungs:  normal respiratory effort and normal breath sounds.   Heart:  normal rate, regular rhythm, and no gallop.   Abdomen:  Bowel sounds positive,abdomen soft and non-tender without masses, organomegaly or hernias noted. Msk:  increased muscle tone in thoracic and c spine  Extremities:  No clubbing, cyanosis, edema, or deformity noted with normal full range of motion of all joints.   Neurologic:  No cranial nerve deficits noted. Station and gait are normal. Plantar reflexes are down-going bilaterally. DTRs are symmetrical throughout.  Sensory, motor and coordinative functions appear intact. Skin:  Intact without suspicious lesions or rashes Psych:  Oriented X3, memory intact for recent and remote, normally interactive, good eye contact, not anxious appearing, and not depressed appearing.     Impression & Recommendations:  Problem # 1:  STRESS DISORDER (ICD-V62.89) Assessment Unchanged Discussed. She is not interested in meds, councelling etc  Problem # 2:  B12 DEFICIENCY (ICD-266.2) Assessment: Improved  On the regimen of medicine(s) reflected in the chart    Orders: TLB-B12, Serum-Total ONLY (04540-J81)  Problem # 3:  SKIN RASH (ICD-782.1) arms  Her updated medication list for this problem includes:    Triamcinolone Acetonide 0.5 % Crea (Triamcinolone acetonide) .Marland Kitchen... Apply bid to affected area  Problem # 4:  NEOPLASM OF UNCERTAIN BEHAVIOR OF SKIN (ICD-238.2) R upper eyelid Assessment: Deteriorated  Orders: Ophthalmology Referral (Ophthalmology)  Problem # 5:  CONSTIPATION, CHRONIC (ICD-564.09) Assessment: Unchanged Amitiza 2 a day for travel Her updated medication list for this problem includes:    Miralax Powd (Polyethylene glycol 3350) .Marland KitchenMarland KitchenMarland KitchenMarland Kitchen 17g by mouth once daily as needed constipation  Complete Medication List: 1)  Lutera 0.1-20 Mg-mcg Tabs (Levonorgestrel-ethinyl estrad) .... Take 1 tablet by mouth once a day 2)  Miralax Powd (Polyethylene glycol 3350) .Marland Kitchen.. 17g by mouth once daily as needed constipation 3)  Promethazine Hcl 25 Mg Tabs (Promethazine hcl) .Marland Kitchen.. 1 by mouth qid as needed nausea 4)  Meclizine Hcl 12.5 Mg Tabs (Meclizine hcl) .Marland Kitchen.. 1 by mouth qid as needed vertigo 5)  Vitamin D3 1000 Unit Tabs (Cholecalciferol) .... 2 by mouth daily 6)  Advair Diskus 100-50 Mcg/dose Misc (Fluticasone-salmeterol) .Marland Kitchen.. 1 puff 2 times daily 7)  Valtrex 500 Mg Tabs (Valacyclovir hcl) .Marland Kitchen.. 1 by mouth two times a day as needed cold sores x 5 d 8)  Potassium Chloride Cr 10 Meq Tbcr (Potassium chloride) .Marland Kitchen.. 1  by mouth once daily 9)  Triamcinolone Acetonide 0.5 % Crea (Triamcinolone acetonide) .... Apply bid to affected area 10)  Zolpidem Tartrate 5 Mg Tabs (Zolpidem tartrate) .Marland Kitchen.. 1-2 by mouth at bedtime as needed insomnia 11)  Cyclobenzaprine Hcl 5 Mg Tabs (Cyclobenzaprine hcl) .... One by mouth once daily as needed 12)  Amitiza 24 Mcg Caps (Lubiprostone) .Marland Kitchen.. 1 by mouth once or twice daily as needed constipation 13)  Metronidazole 0.75 % Lotn (Metronidazole) .... On face qhs 14)  Lopressor 50 Mg Tabs (Metoprolol tartrate) .Marland Kitchen.. 1 by mouth once daily or two times a day as needed palpitations 15)  Zithromax Z-pak 250 Mg Tabs (Azithromycin) .... As dirrected  Other Orders: T-Vitamin D (25-Hydroxy) [id number])  Patient Instructions: 1)  Please schedule a follow-up appointment in March Prescriptions: AMITIZA 24 MCG CAPS (LUBIPROSTONE) 1 by mouth once or twice daily as needed constipation  #60 x 0   Entered and Authorized by:   Macarthur Critchley  Buckner Malta MD   Signed by:   Tresa Garter MD on 02/07/2010   Method used:   Print then Give to Patient   RxID:   [id number] ZITHROMAX Z-PAK 250 MG TABS (AZITHROMYCIN) as dirrected  #1 x 0   Entered and Authorized by:   Tresa Garter MD   Signed by:   Tresa Garter MD on 02/06/2010   Method used:   Print then Give to Patient   RxID:   [id number] MIRALAX   POWD (POLYETHYLENE GLYCOL 3350) 17g by mouth once daily as needed constipation  #527 Gram x 11   Entered and Authorized by:   Tresa Garter MD   Signed by:   Tresa Garter MD on 02/06/2010   Method used:   Print then Give to Patient   RxID:   [id number] PROMETHAZINE HCL 25 MG  TABS (PROMETHAZINE HCL) 1 by mouth qid as needed nausea  #60 x 3   Entered and Authorized by:   Tresa Garter MD   Signed by:   Tresa Garter MD on 02/06/2010   Method used:   Print then Give to Patient   RxID:   [id number] VALTREX 500 MG TABS (VALACYCLOVIR HCL) 1 by  mouth two times a day as needed cold sores x 5 d  #20 x 3   Entered and Authorized by:   Tresa Garter MD   Signed by:   Tresa Garter MD on 02/06/2010   Method used:   Print then Give to Patient   RxID:   [id number] ZOLPIDEM TARTRATE 5 MG TABS (ZOLPIDEM TARTRATE) 1-2 by mouth at bedtime as needed insomnia  #60 x 6   Entered and Authorized by:   Tresa Garter MD   Signed by:   Tresa Garter MD on 02/06/2010   Method used:   Print then Give to Patient   RxID:   [id number] LOPRESSOR 50 MG TABS (METOPROLOL TARTRATE) 1 by mouth once daily or two times a day as needed palpitations  #60 x 6   Entered and Authorized by:   Tresa Garter MD   Signed by:   Tresa Garter MD on 02/06/2010   Method used:   Print then Give to Patient   RxID:   [id number] TRIAMCINOLONE ACETONIDE 0.5 % CREA (TRIAMCINOLONE ACETONIDE) apply bid to affected area  #120 g x 3   Entered and Authorized by:   Tresa Garter MD   Signed by:   Tresa Garter MD on 02/06/2010   Method used:   Print then Give to Patient   RxID:   [id number] METRONIDAZOLE 0.75 % LOTN (METRONIDAZOLE) on face qhs  #90 g x 6   Entered and Authorized by:   Tresa Garter MD   Signed by:   Tresa Garter MD on 02/06/2010   Method used:   Print then Give to Patient   RxID:   [id number] AMITIZA 24 MCG CAPS (LUBIPROSTONE) 1 by mouth once or twice daily as needed constipation  #30 x 12   Entered and Authorized by:   Tresa Garter MD   Signed by:   Tresa Garter MD on 02/06/2010   Method used:   Print then Give to Patient   RxID:   [id number]    Orders Added: 1)  TLB-B12, Serum-Total ONLY [10175-Z02] 2)  T-Vitamin D (25-Hydroxy) [58527-78242] 3)  Est. Patient Level IV [35361] 4)  Ophthalmology Referral [Ophthalmology]

## 2010-02-14 ENCOUNTER — Telehealth: Payer: Self-pay | Admitting: Internal Medicine

## 2010-02-15 LAB — CONVERTED CEMR LAB: Vit D, 25-Hydroxy: 23 ng/mL — ABNORMAL LOW (ref 30–89)

## 2010-02-21 NOTE — Progress Notes (Signed)
Summary: lab results  Phone Note Call from Patient   Caller: Patient Summary of Call: Patient called [facility] requesting lab results.Alvy Beal Archie CMA  February 14, 2010 9:25 AM    Follow-up for Phone Call        pt informed  Follow-up by: Lanier Prude, [facility]),  February 15, 2010 2:26 PM

## 2010-05-21 NOTE — Letter (Signed)
March 13, 2008    Amber Quint. Plotnikov, MD  [address]  [place], [place] [address]   RE:  Amber Lowe  MRN:  [id number]  /  DOB:  05-May-1960   Dear Dr. Posey Rea:   It was my pleasure to see Amber Lowe in electrophysiology consultation  today regarding her palpitations.  As you recall, Dr. Scorsone is a pleasant  50 year old female with a history of hepatitis B and vertigo who was  recently evaluated in your office for palpitations.  She notes that on  February 27, 2008, she developed a chest pounding, which lasted  several hours.  She did check her pulse and noted it to be 92 beats per  minute.  She also reports taking her blood pressure and found it to be  100/67 mmHg.  She notes that she had had cold sores around the same time  and had been taking a new vitamin B complex pill, which advertises  increased energy provided.  She took this pill for 4 days and had  similar palpitations and chest pounding for this 4 days.  Upon cessation  of this medication, she has had no further chest pounding.  She notes  that 2-3 days ago, she took 1 additional vitamin B complex pill and  again had several hours of chest pounding.  She denies chest discomfort.  She denies chest pain or tightness, shortness of breath, orthopnea, PND,  lower extremity edema, presyncope, or syncope.  She is rather healthy  and walks frequently 45 minutes without any symptoms of angina or heart  failure.  She is otherwise without complaint today.   PAST MEDICAL HISTORY:  1. Hepatitis B.  2. Vertigo.  3. Childhood asthma, now resolved.   PAST SURGICAL HISTORY:  None.   ALLERGIES:  ALCOHOL causes rash.   CURRENT MEDICATIONS:  1. Vitamin B12 daily.  2. Vitamin D daily.  3. Birth control pills daily.   SOCIAL HISTORY:  The patient lives in [place] with her spouse.  She  has a Ph.D. in Cytogeneticist from [employer]  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  She has a son who is 15.  She denies tobacco, alcohol, or drug  use.   FAMILY HISTORY:  The patient's brother died with cerebral aneurysm  rupture.  She denies family history of coronary artery disease, sudden  cardiac death, or arrhythmias.   REVIEW OF SYSTEMS:  All systems were reviewed and negative except as  outlined in the HPI above.   PHYSICAL EXAMINATION:  VITAL SIGNS:  Blood pressure 136/77, heart rate  90, respirations 18, weight 117 pounds.  GENERAL:  The patient is a thin female in no acute distress.  She is  alert and oriented x3.  HEENT:  Normocephalic, atraumatic.  Sclerae are clear.  Conjunctivae are  pink.  Oropharynx is clear.  NECK:  Supple.  No JVD, thyromegaly, or bruits.  LUNGS:  Clear to auscultation bilaterally.  HEART:  Regular rate and rhythm.  No murmurs, rubs, or gallops.  GI:  Soft, nontender, nondistended.  Positive bowel sounds.  EXTREMITIES:  No clubbing, cyanosis, or edema.  NEUROLOGIC:  Strength and sensation are intact.  SKIN:  No ecchymoses or lacerations.  MUSCULOSKELETAL:  No deformity or atrophy.  PSYCH:  Euthymic mood, full affect.   EKG performed in your office document sinus rhythm at 70 beats per  minute and is otherwise normal.   IMPRESSION:  Amber Lowe is a very pleasant 50 year old female with a  history of hepatitis B and no cardiac risk factors who now presents for  further evaluation of chest pounding.  She carefully documented her  pulse during the episode and found it to be 90.  I therefore think that  this nearly excludes an arrhythmia.  She also took her blood pressure  and found her systolic blood pressure to be 045.  If this is correct,  then this would not be suggestive of hypertension.  Certainly  temporarily, her events were associated with taking a vitamin B complex.  She notes that they have resolved since discontinuing the vitamin B  complex.  I think that the most advisable strategy long term would be to  avoid  this particular vitamin B complex fibrillation in the future.  The  patient has no signs or symptoms of ischemia or heart failure.  I do  think that an echocardiogram would be helpful to evaluate for any  evidence of structural heart disease.  If this is normal then I do not  think that any further cardiac evaluation is advised at this time.  If  she has any evidence of structural heart disease then we will  investigate this as necessary.   PLAN:  1. Transthoracic echocardiogram.  2. The patient will follow up with Dr. Posey Rea as previously      scheduled.   Dr. Posey Rea, thank you for the opportunity of participating in the  care of Amber Lowe.  Please feel free to contact me if I can be of further  assistance.    Sincerely,      Amber Range, MD  Electronically Signed    JA/MedQ  DD: 03/13/2008  DT: 03/14/2008  Job #: [id number]

## 2010-07-16 ENCOUNTER — Ambulatory Visit (INDEPENDENT_AMBULATORY_CARE_PROVIDER_SITE_OTHER): Payer: BC Managed Care – PPO | Admitting: Internal Medicine

## 2010-07-16 ENCOUNTER — Encounter: Payer: Self-pay | Admitting: Internal Medicine

## 2010-07-16 DIAGNOSIS — M79631 Pain in right forearm: Secondary | ICD-10-CM | POA: Insufficient documentation

## 2010-07-16 DIAGNOSIS — R519 Headache, unspecified: Secondary | ICD-10-CM | POA: Insufficient documentation

## 2010-07-16 DIAGNOSIS — E538 Deficiency of other specified B group vitamins: Secondary | ICD-10-CM

## 2010-07-16 DIAGNOSIS — M79609 Pain in unspecified limb: Secondary | ICD-10-CM

## 2010-07-16 DIAGNOSIS — R51 Headache: Secondary | ICD-10-CM

## 2010-07-16 DIAGNOSIS — E559 Vitamin D deficiency, unspecified: Secondary | ICD-10-CM

## 2010-07-16 MED ORDER — ELETRIPTAN HYDROBROMIDE 40 MG PO TABS: 40.0000 mg | ORAL_TABLET | ORAL | Status: DC | PRN

## 2010-07-16 MED ORDER — CYANOCOBALAMIN 1000 MCG/ML IJ SOLN
1000.0000 ug | Freq: Once | INTRAMUSCULAR | Status: AC
  Administered 2010-07-16: 1000 ug via INTRAMUSCULAR

## 2010-07-16 MED ORDER — POLYETHYLENE GLYCOL 3350 17 GM/SCOOP PO POWD: 17.0000 g | Freq: Every day | ORAL | Status: AC | PRN

## 2010-07-16 MED ORDER — ZOLPIDEM TARTRATE 5 MG PO TABS: 5.0000 mg | ORAL_TABLET | Freq: Every evening | ORAL | Status: DC | PRN

## 2010-07-16 MED ORDER — DICLOFENAC SODIUM 1.5 % TD SOLN: 5.0000 [drp] | Freq: Four times a day (QID) | TRANSDERMAL | Status: DC | PRN

## 2010-07-16 MED ORDER — NAPROXEN-ESOMEPRAZOLE 500-20 MG PO TBEC: 1.0000 | DELAYED_RELEASE_TABLET | Freq: Two times a day (BID) | ORAL | Status: DC

## 2010-07-16 NOTE — Progress Notes (Signed)
  Subjective:    Patient ID: Amber Lowe, female    DOB: 1960-03-04, 50 y.o.   MRN: [id number]  HPI  C/o R forearm pain F/u Vit B12 and vit D def F/u constipation and fatigue  Review of Systems  Constitutional: Positive for fatigue.  Respiratory: Negative for cough, chest tightness and wheezing.   Cardiovascular: Negative for leg swelling.  Gastrointestinal: Negative for abdominal pain and constipation.  Musculoskeletal: Positive for myalgias (as above). Negative for joint swelling and arthralgias.  Neurological: Negative for headaches.  Psychiatric/Behavioral: Positive for sleep disturbance. Negative for suicidal ideas. The patient is nervous/anxious.        Objective:   Physical Exam  Constitutional: She appears well-developed and well-nourished. No distress.  HENT:  Head: Normocephalic.  Right Ear: External ear normal.  Left Ear: External ear normal.  Nose: Nose normal.  Mouth/Throat: Oropharynx is clear and moist.  Eyes: Conjunctivae are normal. Pupils are equal, round, and reactive to light. Right eye exhibits no discharge. Left eye exhibits no discharge.  Neck: Normal range of motion. Neck supple. No JVD present. No tracheal deviation present. No thyromegaly present.  Cardiovascular: Normal rate, regular rhythm and normal heart sounds.   Pulmonary/Chest: No stridor. No respiratory distress. She has no wheezes.  Abdominal: Soft. Bowel sounds are normal. She exhibits no distension and no mass. There is no tenderness. There is no rebound and no guarding.  Musculoskeletal: She exhibits tenderness (R prox lat forearm is tender). She exhibits no edema.  Lymphadenopathy:    She has no cervical adenopathy.  Neurological: She displays normal reflexes. No cranial nerve deficit. She exhibits normal muscle tone. Coordination normal.  Skin: No rash noted. No erythema.  Psychiatric: She has a normal mood and affect. Her behavior is normal. Judgment and thought content normal.         Assessment & Plan:

## 2010-07-21 NOTE — Assessment & Plan Note (Signed)
On Rx 

## 2010-07-21 NOTE — Assessment & Plan Note (Signed)
Voltaren gel  Ice Will inject if not bettter

## 2010-07-21 NOTE — Assessment & Plan Note (Signed)
Relieve stress Meds for cervical tension

## 2010-08-15 ENCOUNTER — Telehealth: Payer: Self-pay

## 2010-08-15 NOTE — Telephone Encounter (Signed)
Patient called [facility] stating that she has appt mon 08/19/10 for an elbow cortisone injection. She plans to travel to [place] immediatly after her appointment and would like to know if she will be ok to travel. Please advise, if not then she will r/s appointment.

## 2010-08-15 NOTE — Telephone Encounter (Signed)
Technically, it is OK. However, it is better to do it on Friday Thx

## 2010-08-16 NOTE — Telephone Encounter (Signed)
Patient notified per MD.

## 2010-08-19 ENCOUNTER — Encounter: Payer: Self-pay | Admitting: Internal Medicine

## 2010-08-19 ENCOUNTER — Ambulatory Visit (INDEPENDENT_AMBULATORY_CARE_PROVIDER_SITE_OTHER): Payer: BC Managed Care – PPO | Admitting: Internal Medicine

## 2010-08-19 VITALS — BP 122/80 | HR 84 | Temp 97.9°F | Resp 16 | Wt 131.0 lb

## 2010-08-19 DIAGNOSIS — F419 Anxiety disorder, unspecified: Secondary | ICD-10-CM | POA: Insufficient documentation

## 2010-08-19 DIAGNOSIS — R5381 Other malaise: Secondary | ICD-10-CM

## 2010-08-19 DIAGNOSIS — M79609 Pain in unspecified limb: Secondary | ICD-10-CM

## 2010-08-19 DIAGNOSIS — F411 Generalized anxiety disorder: Secondary | ICD-10-CM

## 2010-08-19 DIAGNOSIS — M25521 Pain in right elbow: Secondary | ICD-10-CM

## 2010-08-19 DIAGNOSIS — R5383 Other fatigue: Secondary | ICD-10-CM

## 2010-08-19 DIAGNOSIS — M25529 Pain in unspecified elbow: Secondary | ICD-10-CM

## 2010-08-19 DIAGNOSIS — M79631 Pain in right forearm: Secondary | ICD-10-CM

## 2010-08-19 DIAGNOSIS — R002 Palpitations: Secondary | ICD-10-CM

## 2010-08-19 MED ORDER — METHYLPREDNISOLONE ACETATE 40 MG/ML IJ SUSP: 40.0000 mg | Freq: Once | INTRAMUSCULAR | Status: DC

## 2010-08-19 MED ORDER — PROPRANOLOL HCL 10 MG PO TABS: 10.0000 mg | ORAL_TABLET | Freq: Two times a day (BID) | ORAL | Status: DC

## 2010-08-19 MED ORDER — AZITHROMYCIN 250 MG PO TABS: 250.0000 mg | ORAL_TABLET | Freq: Every day | ORAL | Status: AC

## 2010-08-19 MED ORDER — FLUOXETINE HCL 10 MG PO TABS: 10.0000 mg | ORAL_TABLET | Freq: Every day | ORAL | Status: DC

## 2010-08-19 NOTE — Progress Notes (Signed)
  Subjective:    Patient ID: Amber Lowe, female    DOB: [birth year], 50 y.o.   MRN: [id number]  HPI  C/o pain in R arm - not better C/o palpitations - worse w/stress and travel; c/o anxiety when away from home  Review of Systems  Constitutional: Negative for chills, activity change, appetite change, fatigue and unexpected weight change.  HENT: Negative for congestion, mouth sores and sinus pressure.   Eyes: Negative for visual disturbance.  Respiratory: Negative for cough and chest tightness.   Cardiovascular: Positive for palpitations. Negative for chest pain and leg swelling.  Gastrointestinal: Negative for nausea and abdominal pain.  Genitourinary: Negative for frequency, difficulty urinating and vaginal pain.  Musculoskeletal: Negative for back pain and gait problem.  Skin: Negative for pallor and rash.  Neurological: Negative for dizziness, tremors, weakness, numbness and headaches.  Psychiatric/Behavioral: Negative for suicidal ideas, confusion and sleep disturbance. The patient is nervous/anxious.        Objective:   Physical Exam  Constitutional: She appears well-developed. No distress.  Neck: No thyromegaly present.  Cardiovascular: Normal rate and regular rhythm.  Exam reveals no gallop and no friction rub.   No murmur heard. Pulmonary/Chest: She has no wheezes. She has no rales.  Abdominal: There is no tenderness.  Musculoskeletal: She exhibits tenderness.       R elbow is tender medially over epicondyl and R extensors carpi  Neurological: She displays normal reflexes. She exhibits normal muscle tone.  Skin: No rash noted. No erythema.  Psychiatric: She has a normal mood and affect. Her behavior is normal. Judgment and thought content normal.          Assessment & Plan:

## 2010-08-19 NOTE — Patient Instructions (Signed)
Postprocedure instructions :    A Band-Aid should be left on for 12 hours. Injection therapy is not a cure itself. It is used in conjunction with other modalities. You can use nonsteroidal anti-inflammatories like ibuprofen , hot and cold compresses. Rest is recommended in the next 24 hours. You need to report immediately  if fever, chills or any signs of infection develop. 

## 2010-08-19 NOTE — Assessment & Plan Note (Signed)
Options to treat were discussed. She elected aspiration/steroids   Procedure Note :    Procedure :Elbow  Lat epic. steroid injection   Indication: Tennis elbow with refractory  chronic pain.   Risks including unsuccessful procedure , bleeding, infection, bruising, skin atrophy and others were explained to the patient in detail as well as the benefits. Informed consent was obtained and signed.   Tthe patient was placed in a comfortable position. Skin was prepped with Betadine and alcohol  and anesthetized with a cooling spray.A 25 gauge 1 inch needle on a 3 ml syringe was used to inject 20 mg of Depo-Medrol and 1 cc Lido was injected in the lateral epicondyl in  A fan like fasion . I also injected a trigger point over extensors carpi 2" distal from R lat epic w/20 mg depo and 1 cc lido. Band-Aid was applied x2. Tolerated well. Complications: None. Good pain relief following the procedure.   Postprocedure instructions :    A Band-Aid should be left on for 12 hours. Injection therapy is not a cure itself. It is used in conjunction with other modalities. You can use nonsteroidal anti-inflammatories like ibuprofen , hot and cold compresses. Rest is recommended in the next 24 hours. You need to report immediately  if fever, chills or any signs of infection develop.

## 2010-08-19 NOTE — Assessment & Plan Note (Signed)
Propranolol prn 

## 2010-08-19 NOTE — Assessment & Plan Note (Signed)
Start low dose Fluoxetine

## 2010-08-28 ENCOUNTER — Other Ambulatory Visit (INDEPENDENT_AMBULATORY_CARE_PROVIDER_SITE_OTHER): Payer: BC Managed Care – PPO

## 2010-08-28 DIAGNOSIS — R5383 Other fatigue: Secondary | ICD-10-CM

## 2010-08-28 DIAGNOSIS — R5381 Other malaise: Secondary | ICD-10-CM

## 2010-08-28 LAB — CBC WITH DIFFERENTIAL/PLATELET
Basophils Absolute: 0 10*3/uL (ref 0.0–0.1)
Eosinophils Absolute: 0.2 10*3/uL (ref 0.0–0.7)
Eosinophils Relative: 3.6 % (ref 0.0–5.0)
HCT: 45.5 % (ref 36.0–46.0)
Lymphs Abs: 2.4 10*3/uL (ref 0.7–4.0)
MCV: 95.3 fl (ref 78.0–100.0)
Monocytes Absolute: 0.4 10*3/uL (ref 0.1–1.0)
Neutrophils Relative %: 52.3 % (ref 43.0–77.0)
Platelets: 273 10*3/uL (ref 150.0–400.0)
RDW: 13.4 % (ref 11.5–14.6)
WBC: 6.6 10*3/uL (ref 4.5–10.5)

## 2010-08-28 LAB — COMPREHENSIVE METABOLIC PANEL
ALT: 21 U/L (ref 0–35)
AST: 18 U/L (ref 0–37)
Albumin: 4.4 g/dL (ref 3.5–5.2)
Alkaline Phosphatase: 45 U/L (ref 39–117)
Glucose, Bld: 96 mg/dL (ref 70–99)
Potassium: 4.5 mEq/L (ref 3.5–5.1)
Sodium: 143 mEq/L (ref 135–145)
Total Bilirubin: 1.1 mg/dL (ref 0.3–1.2)
Total Protein: 7.7 g/dL (ref 6.0–8.3)

## 2010-08-28 LAB — MAGNESIUM: Magnesium: 2.5 mg/dL (ref 1.5–2.5)

## 2010-09-02 ENCOUNTER — Encounter: Payer: Self-pay | Admitting: Gynecology

## 2010-09-02 ENCOUNTER — Other Ambulatory Visit (HOSPITAL_COMMUNITY)
Admission: RE | Admit: 2010-09-02 | Discharge: 2010-09-02 | Disposition: A | Discharge status: Home / Self Care | Payer: BC Managed Care – PPO | Source: Ambulatory Visit | Attending: Gynecology | Admitting: Gynecology

## 2010-09-02 ENCOUNTER — Ambulatory Visit (INDEPENDENT_AMBULATORY_CARE_PROVIDER_SITE_OTHER): Payer: BC Managed Care – PPO | Admitting: Gynecology

## 2010-09-02 VITALS — BP 120/80 | Ht 62.0 in | Wt 129.0 lb

## 2010-09-02 DIAGNOSIS — Z01419 Encounter for gynecological examination (general) (routine) without abnormal findings: Secondary | ICD-10-CM

## 2010-09-02 DIAGNOSIS — N760 Acute vaginitis: Secondary | ICD-10-CM

## 2010-09-02 DIAGNOSIS — N898 Other specified noninflammatory disorders of vagina: Secondary | ICD-10-CM

## 2010-09-02 DIAGNOSIS — Z3041 Encounter for surveillance of contraceptive pills: Secondary | ICD-10-CM

## 2010-09-02 DIAGNOSIS — L293 Anogenital pruritus, unspecified: Secondary | ICD-10-CM

## 2010-09-02 DIAGNOSIS — N951 Menopausal and female climacteric states: Secondary | ICD-10-CM

## 2010-09-02 DIAGNOSIS — J029 Acute pharyngitis, unspecified: Secondary | ICD-10-CM

## 2010-09-02 DIAGNOSIS — B373 Candidiasis of vulva and vagina: Secondary | ICD-10-CM

## 2010-09-02 MED ORDER — FLUCONAZOLE 150 MG PO TABS: 150.0000 mg | ORAL_TABLET | Freq: Once | ORAL | Status: AC

## 2010-09-02 MED ORDER — NORETHIN ACE-ETH ESTRAD-FE 1-20 MG-MCG PO TABS: 1.0000 | ORAL_TABLET | Freq: Every day | ORAL | Status: DC

## 2010-09-02 MED ORDER — AZITHROMYCIN 250 MG PO TABS: ORAL_TABLET | ORAL | Status: AC

## 2010-09-02 NOTE — Progress Notes (Signed)
Amber Lowe 09/01/1960 [id number]        50 y.o.  for annual exam.  Overall doing well. Continues on low-dose oral contraceptives for menstrual regulation and contraception. She had a mildly elevated FSH last year at 26. She is a known hepatitis B carrier has followed up with her gastroenterologist and has normal blood studies.  She does note a vaginal itching and slight discharge over the last week. Also the last several days a sore throat.  Past medical history,surgical history, allergies, family history and social history were all reviewed and documented in the EPIC chart. ROS:  Was performed and pertinent positives and negatives are included in the history.  Exam: chaperone present Filed Vitals:   09/02/10 1137  BP: 120/80   General appearance  Normal Skin grossly normal Head/Neck normal with no cervical or supraclavicular adenopathy thyroid normal.  Mild pharyngeal erythema noted no exudates or swelling Lungs  clear Cardiac RR, without RMG Abdominal  soft, nontender, without masses, organomegaly or hernia Breasts  examined lying and sitting without masses, retractions, discharge or axillary adenopathy. Pelvic  Ext/BUS/vagina  normal white discharge noted KOH wet prep done  Cervix  normal  Pap done  Uterus  Anteverted, normal size, shape and contour, midline and mobile nontender   Adnexa  Without masses or tenderness    Anus and perineum  normal   Rectovaginal  normal sphincter tone without palpated masses or tenderness.    Assessment/Plan:  50 y.o. female for annual exam.    #1 Pharyngitis. Sore throat times several days mild erythema noted I described the Z-Pak followup if symptoms persist or recur. #2  White discharge. KOH wet prep is positive for yeast. We'll treat with Diflucan 150x1 dose. Refill x4 given that she does have recurrences throughout the year. #3 Oral contraceptives. Patient prefers to remain on oral contraceptives at this time particularly for menstrual  regulation given frequent travel to [place]. I again discussed the risks of oral contraceptives to include stroke heart attack DVT. I discussed long airplane rides need to get up and walk around. She is otherwise healthy is active and I refilled her prescription times a year. I rechecked [facility] today. I discussed at some point we'll need to stop the pills once her [facility] starts to rise for now we'll continue her on them at her choice and she clearly understands and accepts the risks. #4 Hepatitis B carrier. She's actually been seen by gastroenterologist and will continue to follow up with them. #5 Health maintenance. Patient had mammogram March 2012 and will continue with annual mammography. Self breast exams on monthly basis discussed urge. She had her colonoscopy March 2011 is due for repeat in 5 years. The blood work was done his she reports having all done recently through her primary office and will continue to follow up with them, otherwise followup with me for her [facility] results    [name] MD, 12:55 PM 09/02/2010

## 2010-09-04 ENCOUNTER — Telehealth: Payer: Self-pay | Admitting: *Deleted

## 2010-09-04 DIAGNOSIS — Z780 Asymptomatic menopausal state: Secondary | ICD-10-CM

## 2010-09-04 NOTE — Telephone Encounter (Signed)
PT INFORMED WITH THE BELOW, ORDER IN COMPUTER AND RECALL LETTER ALSO.

## 2010-09-04 NOTE — Telephone Encounter (Signed)
Message copied by Aura Camps on Wed Sep 04, 2010  3:14 PM ------      Message from: [name]      Created: Wed Sep 04, 2010  2:02 PM       Tell patient FSH normal. Continue birth control pills and will recheck in 6 months during pill free week.

## 2010-09-17 ENCOUNTER — Telehealth: Payer: Self-pay | Admitting: *Deleted

## 2010-09-17 DIAGNOSIS — Z3041 Encounter for surveillance of contraceptive pills: Secondary | ICD-10-CM

## 2010-09-17 MED ORDER — NORETHIN ACE-ETH ESTRAD-FE 1-20 MG-MCG PO TABS: 1.0000 | ORAL_TABLET | Freq: Every day | ORAL | Status: DC

## 2010-09-17 NOTE — Telephone Encounter (Signed)
PT CALLED STATING RX FOR BCP NEEDS 90 DAY SUPPLY FOR [facility]. BCP REORDER AND SENT TO [facility] PER REQUEST.

## 2010-09-24 ENCOUNTER — Other Ambulatory Visit: Payer: Self-pay | Admitting: Gynecology

## 2010-09-24 ENCOUNTER — Telehealth: Payer: Self-pay | Admitting: *Deleted

## 2010-09-24 MED ORDER — NORGESTIMATE-ETH ESTRADIOL 0.25-35 MG-MCG PO TABS: 1.0000 | ORAL_TABLET | Freq: Every day | ORAL | Status: DC

## 2010-09-24 NOTE — Telephone Encounter (Signed)
Which one Ortho Cyclen or Ortho Tri Cyclen?

## 2010-09-24 NOTE — Telephone Encounter (Signed)
okay

## 2010-09-24 NOTE — Telephone Encounter (Signed)
Patient said she got notification from her insurance company that they will not cover birth control pills because of her age.  Patient wants to know if we can PRINT an RX for Ortho Cyclen or Ortho Tri Cyclen so she can take to [facility] and pay out of pocket (9-10 dollars) Can do 90 day RX.  Would that be ok? Please advise.

## 2010-09-24 NOTE — Telephone Encounter (Signed)
Patient informed. Note below.  Printed RX will mail to patient per request.

## 2010-09-24 NOTE — Telephone Encounter (Signed)
Ortho cyclen

## 2011-03-11 ENCOUNTER — Encounter: Payer: Self-pay | Admitting: Gynecology

## 2011-03-31 ENCOUNTER — Other Ambulatory Visit: Payer: BC Managed Care – PPO

## 2011-03-31 ENCOUNTER — Other Ambulatory Visit: Payer: Self-pay | Admitting: Gynecology

## 2011-03-31 DIAGNOSIS — Z780 Asymptomatic menopausal state: Secondary | ICD-10-CM

## 2011-05-21 ENCOUNTER — Ambulatory Visit: Payer: Self-pay

## 2011-05-21 ENCOUNTER — Other Ambulatory Visit: Payer: Self-pay | Admitting: Occupational Medicine

## 2011-05-21 DIAGNOSIS — Z0000 Encounter for general adult medical examination without abnormal findings: Secondary | ICD-10-CM

## 2011-05-30 ENCOUNTER — Encounter: Payer: Self-pay | Admitting: Internal Medicine

## 2011-05-30 ENCOUNTER — Ambulatory Visit (INDEPENDENT_AMBULATORY_CARE_PROVIDER_SITE_OTHER): Payer: BC Managed Care – PPO | Admitting: Internal Medicine

## 2011-05-30 VITALS — BP 104/80 | HR 80 | Temp 97.8°F | Resp 16 | Wt 131.0 lb

## 2011-05-30 DIAGNOSIS — J45909 Unspecified asthma, uncomplicated: Secondary | ICD-10-CM

## 2011-05-30 DIAGNOSIS — E559 Vitamin D deficiency, unspecified: Secondary | ICD-10-CM

## 2011-05-30 DIAGNOSIS — E538 Deficiency of other specified B group vitamins: Secondary | ICD-10-CM

## 2011-05-30 DIAGNOSIS — K5909 Other constipation: Secondary | ICD-10-CM

## 2011-05-30 DIAGNOSIS — J309 Allergic rhinitis, unspecified: Secondary | ICD-10-CM | POA: Insufficient documentation

## 2011-05-30 DIAGNOSIS — R51 Headache: Secondary | ICD-10-CM

## 2011-05-30 DIAGNOSIS — R002 Palpitations: Secondary | ICD-10-CM

## 2011-05-30 MED ORDER — AZITHROMYCIN 250 MG PO TABS: ORAL_TABLET | ORAL | Status: AC

## 2011-05-30 MED ORDER — ELETRIPTAN HYDROBROMIDE 40 MG PO TABS: ORAL_TABLET | ORAL | Status: DC

## 2011-05-30 MED ORDER — PROMETHAZINE HCL 25 MG PO TABS: 25.0000 mg | ORAL_TABLET | Freq: Three times a day (TID) | ORAL | Status: DC | PRN

## 2011-05-30 MED ORDER — METOPROLOL TARTRATE 50 MG PO TABS: 50.0000 mg | ORAL_TABLET | Freq: Two times a day (BID) | ORAL | Status: DC

## 2011-05-30 MED ORDER — VALACYCLOVIR HCL 500 MG PO TABS: 500.0000 mg | ORAL_TABLET | Freq: Two times a day (BID) | ORAL | Status: DC

## 2011-05-30 MED ORDER — ZOLPIDEM TARTRATE 5 MG PO TABS: 5.0000 mg | ORAL_TABLET | Freq: Every evening | ORAL | Status: DC | PRN

## 2011-05-30 MED ORDER — TRIAMCINOLONE ACETONIDE 0.5 % EX CREA: TOPICAL_CREAM | Freq: Three times a day (TID) | CUTANEOUS | Status: DC

## 2011-05-30 MED ORDER — FLUTICASONE-SALMETEROL 250-50 MCG/DOSE IN AEPB: 1.0000 | INHALATION_SPRAY | Freq: Two times a day (BID) | RESPIRATORY_TRACT | Status: DC

## 2011-05-30 NOTE — Assessment & Plan Note (Signed)
Cont w/miralax Linzess info given

## 2011-05-30 NOTE — Assessment & Plan Note (Signed)
Doing well on rx.  

## 2011-05-30 NOTE — Assessment & Plan Note (Signed)
Chronic  Pollution of the air in [place] may be a problem... Netty pot

## 2011-05-30 NOTE — Assessment & Plan Note (Signed)
Chronic  Pollution of the air in [place] may be a problem... Netty pot Advair bid

## 2011-05-30 NOTE — Assessment & Plan Note (Signed)
Continue with current prescription therapy as reflected on the Med list.  

## 2011-05-30 NOTE — Progress Notes (Signed)
Patient ID: Amber Lowe, female   DOB: February 02, 1960, 51 y.o.   MRN: [id number]  Subjective:    Patient ID: Amber Lowe, female    DOB: 01-Aug-1960, 51 y.o.   MRN: [id number]  HPI   F/u palpitations - worse w/stress and travel; c/o anxiety when away from home F/u rhinitis, occ asthma, cold sores, constipation. Transfering to [place] x 3 years  Wt Readings from Last 3 Encounters:  05/30/11 131 lb (59.421 kg)  09/02/10 129 lb (58.514 kg)  08/19/10 131 lb (59.421 kg)   BP Readings from Last 3 Encounters:  05/30/11 104/80  09/02/10 120/80  08/19/10 122/80      Review of Systems  Constitutional: Negative for chills, activity change, appetite change, fatigue and unexpected weight change.  HENT: Negative for congestion, mouth sores and sinus pressure.   Eyes: Negative for visual disturbance.  Respiratory: Negative for cough and chest tightness.   Cardiovascular: Positive for palpitations. Negative for chest pain and leg swelling.  Gastrointestinal: Negative for nausea and abdominal pain.  Genitourinary: Negative for frequency, difficulty urinating and vaginal pain.  Musculoskeletal: Negative for back pain and gait problem.  Skin: Negative for pallor and rash.  Neurological: Negative for dizziness, tremors, weakness, numbness and headaches.  Psychiatric/Behavioral: Negative for suicidal ideas, confusion and sleep disturbance. The patient is nervous/anxious.        Objective:   Physical Exam  Constitutional: She appears well-developed. No distress.  Neck: No thyromegaly present.  Cardiovascular: Normal rate and regular rhythm.  Exam reveals no gallop and no friction rub.   No murmur heard. Pulmonary/Chest: She has no wheezes. She has no rales.  Abdominal: There is no tenderness.  Musculoskeletal: She exhibits tenderness.       R elbow is tender medially over epicondyl and R extensors carpi  Neurological: She displays normal reflexes. She exhibits normal muscle tone.  Skin: No  rash noted. No erythema.  Psychiatric: She has a normal mood and affect. Her behavior is normal. Judgment and thought content normal.    Lab Results  Component Value Date   WBC 6.6 08/28/2010   HGB 15.3* 08/28/2010   HCT 45.5 08/28/2010   PLT 273.0 08/28/2010   GLUCOSE 96 08/28/2010   CHOL 211* 01/31/2010   TRIG 132.0 01/31/2010   HDL 60.70 01/31/2010   LDLDIRECT 136.6 01/31/2010   ALT 21 08/28/2010   AST 18 08/28/2010   NA 143 08/28/2010   K 4.5 08/28/2010   CL 105 08/28/2010   CREATININE 0.7 08/28/2010   BUN 14 08/28/2010   CO2 29 08/28/2010   TSH 1.75 08/28/2010   Labs from her company [employer] reviewed      Assessment & Plan:

## 2011-05-30 NOTE — Assessment & Plan Note (Signed)
See meds 

## 2011-06-09 ENCOUNTER — Telehealth: Payer: Self-pay

## 2011-06-09 MED ORDER — ZOLPIDEM TARTRATE 5 MG PO TABS: 5.0000 mg | ORAL_TABLET | Freq: Every evening | ORAL | Status: DC | PRN

## 2011-06-09 NOTE — Telephone Encounter (Signed)
Rx faxed to [facility], pt informed of same.

## 2011-06-09 NOTE — Telephone Encounter (Signed)
Pt called stating that her insurance company requires Rx for Ambien be sent to mail order pharmacy. Pt will be traveling overseas in 14 days and requests that Rx be sent from our office for faster delivery (orginal Rx will be discarded per pt). Rx printed placed on MD's desk for signature.

## 2011-06-12 ENCOUNTER — Encounter: Payer: BC Managed Care – PPO | Admitting: Internal Medicine

## 2011-09-04 ENCOUNTER — Ambulatory Visit (INDEPENDENT_AMBULATORY_CARE_PROVIDER_SITE_OTHER): Payer: BC Managed Care – PPO | Admitting: Gynecology

## 2011-09-04 ENCOUNTER — Encounter: Payer: Self-pay | Admitting: Gynecology

## 2011-09-04 VITALS — BP 128/82 | Ht 61.75 in | Wt 124.0 lb

## 2011-09-04 DIAGNOSIS — Z01419 Encounter for gynecological examination (general) (routine) without abnormal findings: Secondary | ICD-10-CM

## 2011-09-04 DIAGNOSIS — Z309 Encounter for contraceptive management, unspecified: Secondary | ICD-10-CM

## 2011-09-04 MED ORDER — FLUCONAZOLE 150 MG PO TABS: 150.0000 mg | ORAL_TABLET | Freq: Once | ORAL | Status: AC

## 2011-09-04 MED ORDER — NORGESTIMATE-ETH ESTRADIOL 0.25-35 MG-MCG PO TABS: 1.0000 | ORAL_TABLET | Freq: Every day | ORAL | Status: DC

## 2011-09-04 MED ORDER — NITROFURANTOIN MONOHYD MACRO 100 MG PO CAPS: 100.0000 mg | ORAL_CAPSULE | Freq: Two times a day (BID) | ORAL | Status: AC

## 2011-09-04 NOTE — Patient Instructions (Signed)
Follow up in November for blood work. Follow up in one year for annual exam

## 2011-09-04 NOTE — Progress Notes (Signed)
Amber Lowe 1960-10-27 [id number]        51 y.o.  G2P0011 for annual exam.  Doing well.  Past medical history,surgical history, medications, allergies, family history and social history were all reviewed and documented in the EPIC chart. ROS:  Was performed and pertinent positives and negatives are included in the history.  Exam: Amber Lowe assistant Filed Vitals:   09/04/11 1152  BP: 128/82  Height: 5' 1.75" (1.568 m)  Weight: 124 lb (56.246 kg)   General appearance  Normal Skin grossly normal Head/Neck normal with no cervical or supraclavicular adenopathy thyroid normal Lungs  clear Cardiac RR, without RMG Abdominal  soft, nontender, without masses, organomegaly or hernia Breasts  examined lying and sitting without masses, retractions, discharge or axillary adenopathy. Pelvic  Ext/BUS/vagina  normal   Cervix  normal   Uterus  anteverted, normal size, shape and contour, midline and mobile nontender   Adnexa  Without masses or tenderness    Anus and perineum  normal   Rectovaginal  normal sphincter tone without palpated masses or tenderness.    Assessment/Plan:  51 y.o. G74P0011 female for annual exam.   1. Oral contraceptives. Patient continues on oral contraceptives. FSH checked last year was normal. Not having menopausal symptoms during pill free week. We again reviewed risks of pills was age to include stroke heart attack DVT. She is being followed for chronic carrier hepatitis B but has normal liver functions historically. Will recheck [facility] during a pill free week this November when she is back in town. I refilled her pills #6 packs at present the patient clearly understands the issues/risks and accepts. It 2. Hepatitis B carrier. Patient is being followed by Dr. Posey Rea and reports having lab work done through his office. 3. Mammography. Patient had her mammogram May 2013. We'll continue with annual mammography. SBE monthly reviewed. 4. Pap smear. Last Pap smear 2012 was normal.  No Pap smear done today. She has no history of abnormal Pap smears before with numerous normal records in her chart. We'll plan less frequent screening every 3-5 years by current screening guidelines. 5. Colonoscopy. Patient had her colonoscopy 2011. We'll follow up with the recommended interval. 6. Health maintenance. No lab work was done today as she reports having all done through Dr. Loren Racer office. Follow up for [facility] level otherwise follow up in one year, sooner as needed. She is traveling overseas in will be there intermittently over the next 3 years. I gave her a prescription for Diflucan 150 mg #5 and Macrobid 100 mg #14 one by mouth twice a day x7 days to have available in case she develops a yeast infection or a UTI while she is out of the country.    Amber Lords MD, 12:42 PM 09/04/2011

## 2011-09-05 ENCOUNTER — Encounter: Payer: Self-pay | Admitting: Gynecology

## 2011-09-05 LAB — URINALYSIS W MICROSCOPIC + REFLEX CULTURE
Bacteria, UA: NONE SEEN
Bilirubin Urine: NEGATIVE
Casts: NONE SEEN
Crystals: NONE SEEN
Glucose, UA: NEGATIVE mg/dL
Ketones, ur: NEGATIVE mg/dL
Nitrite: NEGATIVE
Specific Gravity, Urine: 1.006 (ref 1.005–1.030)
pH: 6.5 (ref 5.0–8.0)

## 2011-12-01 ENCOUNTER — Encounter: Payer: Self-pay | Admitting: Internal Medicine

## 2011-12-01 ENCOUNTER — Other Ambulatory Visit (INDEPENDENT_AMBULATORY_CARE_PROVIDER_SITE_OTHER): Payer: BC Managed Care – PPO

## 2011-12-01 ENCOUNTER — Ambulatory Visit (INDEPENDENT_AMBULATORY_CARE_PROVIDER_SITE_OTHER): Payer: BC Managed Care – PPO | Admitting: Internal Medicine

## 2011-12-01 VITALS — BP 138/80 | HR 80 | Temp 97.1°F | Resp 16 | Wt 124.0 lb

## 2011-12-01 DIAGNOSIS — J45909 Unspecified asthma, uncomplicated: Secondary | ICD-10-CM

## 2011-12-01 DIAGNOSIS — F411 Generalized anxiety disorder: Secondary | ICD-10-CM

## 2011-12-01 DIAGNOSIS — L309 Dermatitis, unspecified: Secondary | ICD-10-CM | POA: Insufficient documentation

## 2011-12-01 DIAGNOSIS — F419 Anxiety disorder, unspecified: Secondary | ICD-10-CM

## 2011-12-01 DIAGNOSIS — Z0000 Encounter for general adult medical examination without abnormal findings: Secondary | ICD-10-CM

## 2011-12-01 DIAGNOSIS — R21 Rash and other nonspecific skin eruption: Secondary | ICD-10-CM

## 2011-12-01 DIAGNOSIS — E538 Deficiency of other specified B group vitamins: Secondary | ICD-10-CM

## 2011-12-01 DIAGNOSIS — E559 Vitamin D deficiency, unspecified: Secondary | ICD-10-CM

## 2011-12-01 DIAGNOSIS — L259 Unspecified contact dermatitis, unspecified cause: Secondary | ICD-10-CM

## 2011-12-01 LAB — HEPATIC FUNCTION PANEL
ALT: 21 U/L (ref 0–35)
AST: 20 U/L (ref 0–37)
Albumin: 4 g/dL (ref 3.5–5.2)
Alkaline Phosphatase: 34 U/L — ABNORMAL LOW (ref 39–117)
Total Protein: 7.5 g/dL (ref 6.0–8.3)

## 2011-12-01 LAB — CBC WITH DIFFERENTIAL/PLATELET
Basophils Absolute: 0 10*3/uL (ref 0.0–0.1)
Eosinophils Absolute: 0.2 10*3/uL (ref 0.0–0.7)
HCT: 43 % (ref 36.0–46.0)
Lymphs Abs: 1.8 10*3/uL (ref 0.7–4.0)
MCV: 94.4 fl (ref 78.0–100.0)
Monocytes Absolute: 0.4 10*3/uL (ref 0.1–1.0)
Platelets: 291 10*3/uL (ref 150.0–400.0)
RDW: 12.8 % (ref 11.5–14.6)

## 2011-12-01 LAB — URINALYSIS, ROUTINE W REFLEX MICROSCOPIC
Nitrite: NEGATIVE
Total Protein, Urine: NEGATIVE
Urobilinogen, UA: 0.2 (ref 0.0–1.0)

## 2011-12-01 LAB — BASIC METABOLIC PANEL
BUN: 12 mg/dL (ref 6–23)
Chloride: 104 mEq/L (ref 96–112)
Creatinine, Ser: 0.6 mg/dL (ref 0.4–1.2)
Glucose, Bld: 94 mg/dL (ref 70–99)
Potassium: 4.4 mEq/L (ref 3.5–5.1)

## 2011-12-01 LAB — LIPID PANEL: Cholesterol: 207 mg/dL — ABNORMAL HIGH (ref 0–200)

## 2011-12-01 MED ORDER — ZOLPIDEM TARTRATE 5 MG PO TABS: 5.0000 mg | ORAL_TABLET | Freq: Every evening | ORAL | Status: DC | PRN

## 2011-12-01 MED ORDER — VALACYCLOVIR HCL 500 MG PO TABS: 500.0000 mg | ORAL_TABLET | Freq: Two times a day (BID) | ORAL | Status: DC

## 2011-12-01 MED ORDER — TRIAMCINOLONE ACETONIDE 0.5 % EX CREA: TOPICAL_CREAM | Freq: Three times a day (TID) | CUTANEOUS | Status: DC

## 2011-12-01 MED ORDER — AZITHROMYCIN 250 MG PO TABS: ORAL_TABLET | ORAL | Status: DC

## 2011-12-01 MED ORDER — PROMETHAZINE HCL 25 MG PO TABS: 25.0000 mg | ORAL_TABLET | Freq: Three times a day (TID) | ORAL | Status: DC | PRN

## 2011-12-01 NOTE — Assessment & Plan Note (Signed)
We discussed age appropriate health related issues, including available/recomended screening tests and vaccinations. We discussed a need for adhering to healthy diet and exercise. Labs/EKG were reviewed/ordered. All questions were answered.   

## 2011-12-01 NOTE — Assessment & Plan Note (Signed)
Continue with current prescription therapy as reflected on the Med list.  

## 2011-12-01 NOTE — Progress Notes (Signed)
  Subjective:    Patient ID: Amber Lowe, female    DOB: 1960/09/10, 51 y.o.   MRN: [id number]  HPI   The patient is here for a wellness exam. The patient has been doing well overall without major physical or psychological issues going on lately.  C/o rash on back itchy  F/u palpitations - worse w/stress and travel; c/o anxiety when away from home  F/u rhinitis, occ asthma, cold sores, constipation. She did transfered to [place] x 3 years  Wt Readings from Last 3 Encounters:  12/01/11 124 lb (56.246 kg)  09/04/11 124 lb (56.246 kg)  05/30/11 131 lb (59.421 kg)   BP Readings from Last 3 Encounters:  12/01/11 138/80  09/04/11 128/82  05/30/11 104/80      Review of Systems  Constitutional: Negative for chills, activity change, appetite change, fatigue and unexpected weight change.  HENT: Negative for congestion, mouth sores and sinus pressure.   Eyes: Negative for visual disturbance.  Respiratory: Negative for cough and chest tightness.   Cardiovascular: Positive for palpitations. Negative for chest pain and leg swelling.  Gastrointestinal: Negative for nausea and abdominal pain.  Genitourinary: Negative for frequency, difficulty urinating and vaginal pain.  Musculoskeletal: Negative for back pain and gait problem.  Skin: Negative for pallor and rash.  Neurological: Negative for dizziness, tremors, weakness, numbness and headaches.  Psychiatric/Behavioral: Negative for suicidal ideas, confusion and sleep disturbance. The patient is nervous/anxious.        Objective:   Physical Exam  Constitutional: She appears well-developed. No distress.  Neck: No thyromegaly present.  Cardiovascular: Normal rate and regular rhythm.  Exam reveals no gallop and no friction rub.   No murmur heard. Pulmonary/Chest: She has no wheezes. She has no rales.  Abdominal: There is no tenderness.  Musculoskeletal: She exhibits tenderness.       R elbow is tender medially over epicondyl and R  extensors carpi  Neurological: She displays normal reflexes. She exhibits normal muscle tone.  Skin: No rash noted. No erythema.  Psychiatric: She has a normal mood and affect. Her behavior is normal. Judgment and thought content normal.    Lab Results  Component Value Date   WBC 6.6 08/28/2010   HGB 15.3* 08/28/2010   HCT 45.5 08/28/2010   PLT 273.0 08/28/2010   GLUCOSE 96 08/28/2010   CHOL 211* 01/31/2010   TRIG 132.0 01/31/2010   HDL 60.70 01/31/2010   LDLDIRECT 136.6 01/31/2010   ALT 21 08/28/2010   AST 18 08/28/2010   NA 143 08/28/2010   K 4.5 08/28/2010   CL 105 08/28/2010   CREATININE 0.7 08/28/2010   BUN 14 08/28/2010   CO2 29 08/28/2010   TSH 1.75 08/28/2010         Assessment & Plan:

## 2011-12-01 NOTE — Assessment & Plan Note (Addendum)
Re-start current prescription therapy as reflected on the Med list.  

## 2011-12-01 NOTE — Assessment & Plan Note (Signed)
Re-start Rx 

## 2011-12-01 NOTE — Assessment & Plan Note (Signed)
Triamc cream tid prn

## 2011-12-02 ENCOUNTER — Telehealth: Payer: Self-pay | Admitting: Internal Medicine

## 2011-12-02 MED ORDER — VITAMIN D 1000 UNITS PO TABS: 1000.0000 [IU] | ORAL_TABLET | Freq: Every day | ORAL | Status: AC

## 2011-12-02 MED ORDER — VITAMIN B-12 1000 MCG SL SUBL: 1.0000 | SUBLINGUAL_TABLET | Freq: Every day | SUBLINGUAL | Status: DC

## 2011-12-02 NOTE — Telephone Encounter (Signed)
Amber Lowe, please, inform patient that all labs are normal except for a low Vit B12 and Vit D levels. Pls restart both - Rx's are at the drug store Thx

## 2011-12-02 NOTE — Telephone Encounter (Signed)
Pt informed

## 2011-12-06 ENCOUNTER — Encounter: Payer: Self-pay | Admitting: Internal Medicine

## 2011-12-10 ENCOUNTER — Other Ambulatory Visit: Payer: BC Managed Care – PPO

## 2011-12-10 DIAGNOSIS — Z309 Encounter for contraceptive management, unspecified: Secondary | ICD-10-CM

## 2012-02-24 ENCOUNTER — Telehealth: Payer: Self-pay | Admitting: Internal Medicine

## 2012-02-24 NOTE — Telephone Encounter (Signed)
I called [facility] and spoke to [place]. PA is approved x 36 months. Fill date 03/04/12. Pt informed

## 2012-02-24 NOTE — Telephone Encounter (Signed)
Called regarding medication issue. Reported [facility] Phone: [phone] requires a more information from MD before they will refill Ambien for 90 days supply.  Submitted Rx  for Ambien on 02/22/12; but pharmacist only dispensed #15 out of 90 tablets.  [facility] pharmacist told patient that MD needs to provide reason for 90 days supply. Patient travels for work to [place] and is home quarterly so requires 90 days prescriptions which MD provided.  Leaves for [place] 03/01/12.  Please call patient to advise when the prior authorization for 90 day supply is completed since mail order refill will take some time to arrive.

## 2012-03-02 IMAGING — CR DG CHEST 2V
2 series · 2 of 2 positions shown · non-contrast
Comparison: None.

CLINICAL DATA: Physical clearance; nonsmoker

CHEST - 2 VIEW

[view not recorded (1 of 2)]
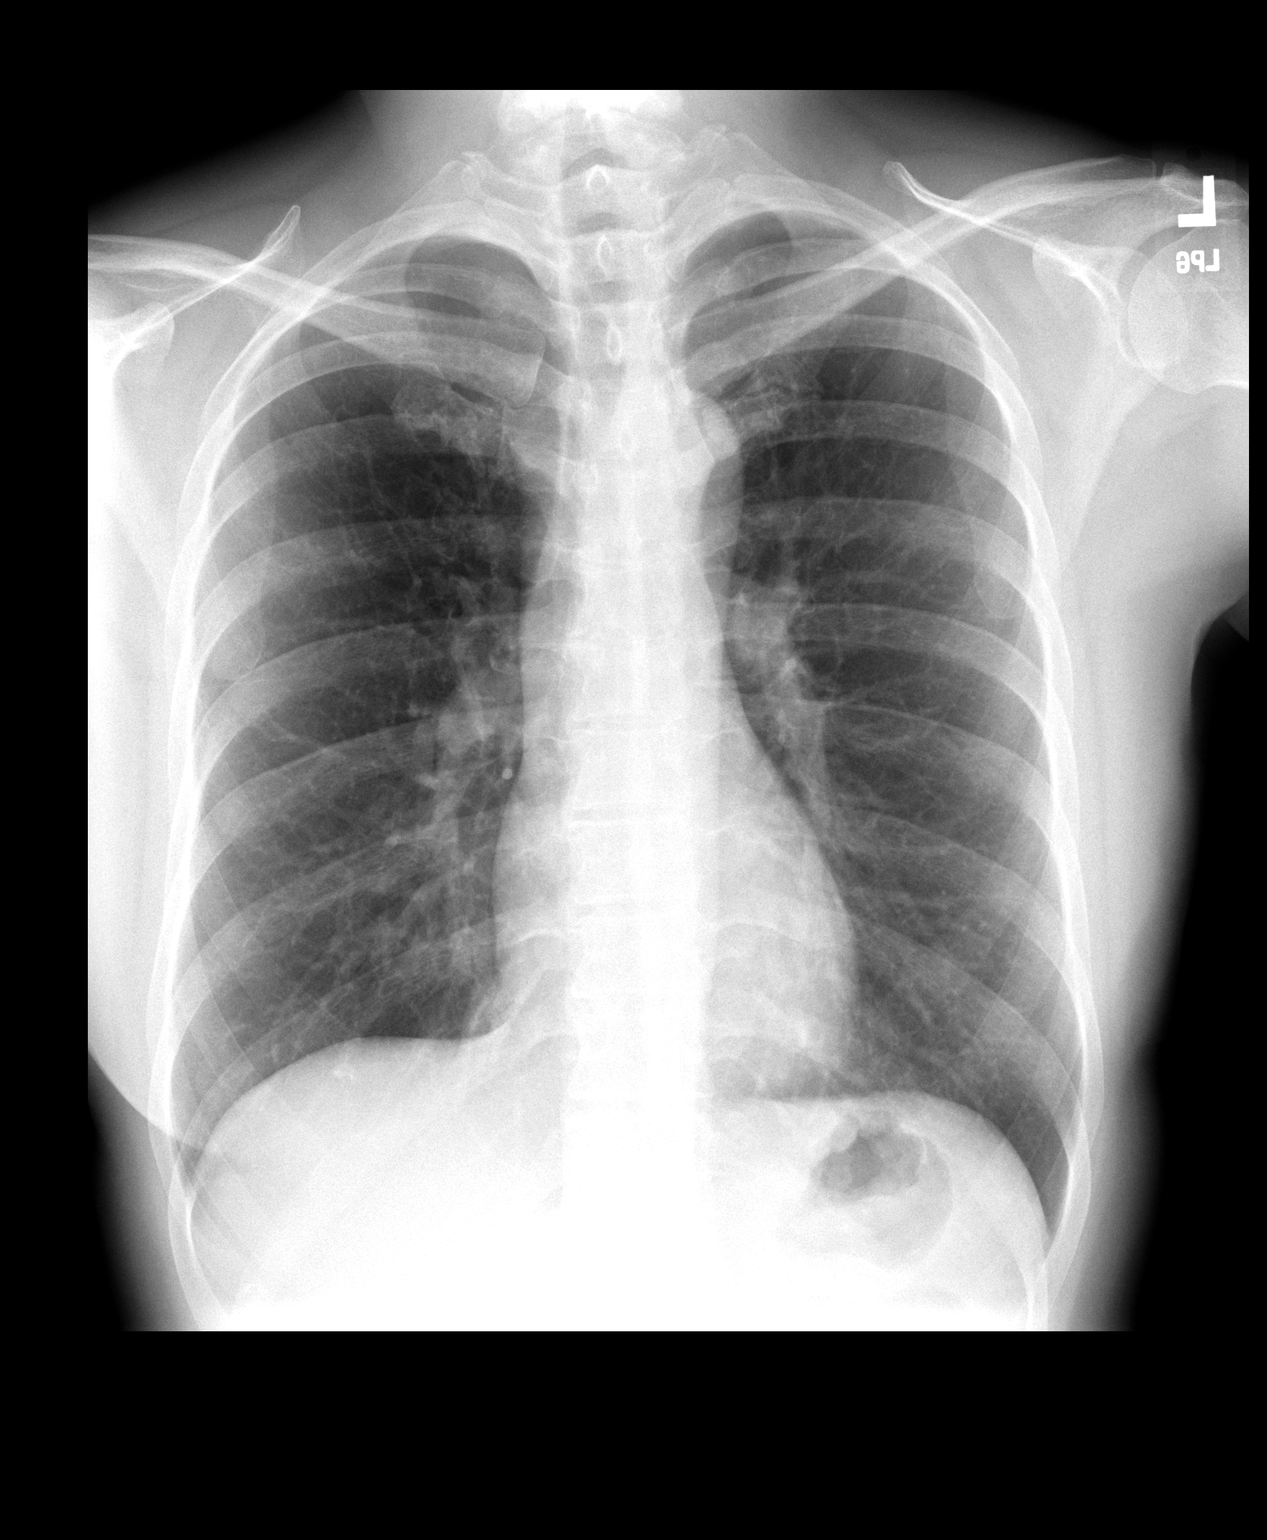

[view not recorded (2 of 2)]
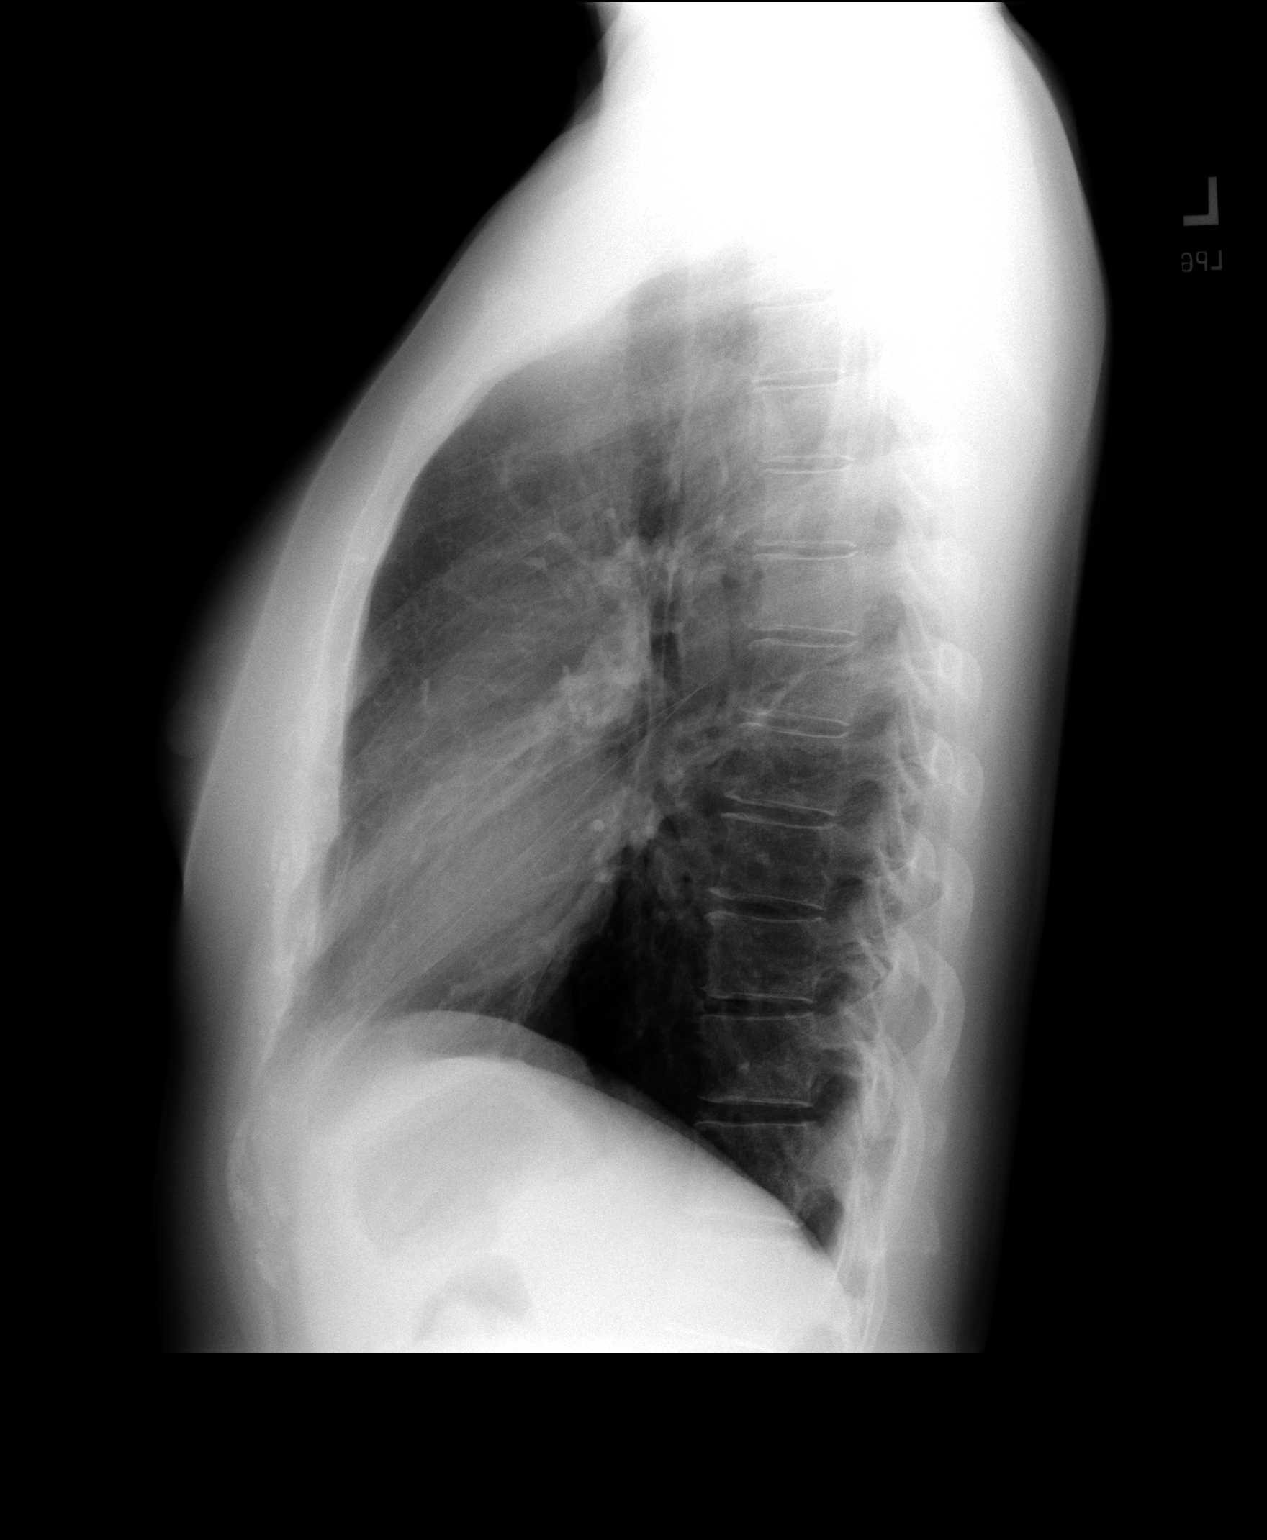

[2 of 2 positions shown; findings below may reference images not displayed]

FINDINGS: The heart size and mediastinal contours are within
normal limits.  Both lungs are clear.  The visualized skeletal
structures are unremarkable.
IMPRESSION: No active cardiopulmonary disease.

## 2012-04-30 ENCOUNTER — Encounter: Payer: Self-pay | Admitting: Gynecology

## 2012-08-17 ENCOUNTER — Encounter: Payer: Self-pay | Admitting: Gynecology

## 2012-08-17 ENCOUNTER — Other Ambulatory Visit (HOSPITAL_COMMUNITY)
Admission: RE | Admit: 2012-08-17 | Discharge: 2012-08-17 | Disposition: A | Discharge status: Home / Self Care | Payer: 59 | Source: Ambulatory Visit | Attending: Gynecology | Admitting: Gynecology

## 2012-08-17 ENCOUNTER — Ambulatory Visit (INDEPENDENT_AMBULATORY_CARE_PROVIDER_SITE_OTHER): Payer: 59 | Admitting: Gynecology

## 2012-08-17 VITALS — BP 120/76 | Ht 62.0 in | Wt 130.0 lb

## 2012-08-17 DIAGNOSIS — B181 Chronic viral hepatitis B without delta-agent: Secondary | ICD-10-CM

## 2012-08-17 DIAGNOSIS — N951 Menopausal and female climacteric states: Secondary | ICD-10-CM

## 2012-08-17 DIAGNOSIS — Z01419 Encounter for gynecological examination (general) (routine) without abnormal findings: Secondary | ICD-10-CM | POA: Insufficient documentation

## 2012-08-17 MED ORDER — FLUCONAZOLE 150 MG PO TABS: 150.0000 mg | ORAL_TABLET | Freq: Once | ORAL | Status: DC

## 2012-08-17 MED ORDER — NITROFURANTOIN MONOHYD MACRO 100 MG PO CAPS: 100.0000 mg | ORAL_CAPSULE | Freq: Two times a day (BID) | ORAL | Status: DC

## 2012-08-17 MED ORDER — ESTRADIOL-NORETHINDRONE ACET 0.5-0.1 MG PO TABS: 1.0000 | ORAL_TABLET | Freq: Every day | ORAL | Status: DC

## 2012-08-17 NOTE — Patient Instructions (Signed)
Start on hormone replacement therapy. Call me if you do any bleeding or have any issues with this. Followup with Dr. Posey Rea for general health maintenance and hepatitis followup. Followup with me in one year, sooner if any issues.

## 2012-08-17 NOTE — Addendum Note (Signed)
Addended by: Dayna Barker on: 08/17/2012 12:47 PM   Modules accepted: Orders

## 2012-08-17 NOTE — Progress Notes (Signed)
Amber Lowe Amber Lowe September 01, 1960 [id number]        52 y.o.  G2P0011 for annual exam.  Several issues noted below.  Past medical history,surgical history, medications, allergies, family history and social history were all reviewed and documented in the EPIC chart.  ROS:  Performed and pertinent positives and negatives are included in the history, assessment and plan .  Exam: Amber Lowe Filed Vitals:   08/17/12 1154  BP: 120/76  Height: 5\' 2"  (1.575 m)  Weight: 130 lb (58.968 kg)   General appearance  Normal Skin grossly normal Head/Neck normal with no cervical or supraclavicular adenopathy thyroid normal Lungs  clear Cardiac RR, without RMG Abdominal  soft, nontender, without masses, organomegaly or hernia Breasts  examined lying and sitting without masses, retractions, discharge or axillary adenopathy. Pelvic  Ext/BUS/vagina  normal with mild atrophic changes  Cervix  normal Pap done  Uterus  anteverted, normal size, shape and contour, midline and mobile nontender   Adnexa  Without masses or tenderness    Anus and perineum  normal   Rectovaginal  normal sphincter tone without palpated masses or tenderness.    Assessment/Plan:  52 y.o. G32P0011 female for annual exam.   1. Menopausal symptoms. Patient had been on oral contraceptives but stopped them in for the last several months has remained without menses and has developed significant hot flushes sweats emotional swings and feeling like heart is pounding. No bleeding at all.  I reviewed the whole issue of HRT with her to include the WHI study with increased risk of stroke, heart attack, DVT and breast cancer. The ACOG and NAMS statements for lowest dose for the shortest period of time reviewed. Transdermal versus oral first-pass effect benefit discussed. After lengthy discussion the patient wants a trial of HRT. Since she has been on the oral contraceptives previously doing well I prescribed Activella 0.5/1 three-month supply for  international travel with one year refill.  Patient is to call if any bleeding or other issues. Does have a history of chronic hepatitis B carrier with normal LFTs. The issue of estrogen in her liver has been discussed on medications previously. 2. 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 Patient is being followed by Dr. Posey Rea is arranging appointment with them this year and will be followed by him for this. 3. Mammography 04/2012. Continue with annual mammography. SBE monthly reviewed 4. Pap smear 2012. Pap repeated today. No history of significant abnormalities before. Repeated a little earlier given her hepatitis B status 5. Colonoscopy 2011. Repeat at their recommended interval. 6. DEXA never. Plan Baseline further into the menopause. 7. Health maintenance. No blood work done today as it is all done through Dr. Loren Racer office. Followup one year, sooner if issues of HRT or any bleeding. I did refill her Macrobid 100 mg twice daily x7 days and Diflucan 150 mg #5 Pap for travel in case she develops a UTI or yeast infection Note: This document was prepared with digital dictation and possible smart phrase technology. Any transcriptional errors that result from this process are unintentional.   [name] MD, 12:31 PM 08/17/2012

## 2012-08-18 LAB — URINALYSIS W MICROSCOPIC + REFLEX CULTURE
Bacteria, UA: NONE SEEN
Casts: NONE SEEN
Glucose, UA: NEGATIVE mg/dL
Hgb urine dipstick: NEGATIVE
Ketones, ur: NEGATIVE mg/dL
pH: 6 (ref 5.0–8.0)

## 2012-11-09 ENCOUNTER — Encounter: Payer: Self-pay | Admitting: Internal Medicine

## 2012-11-11 ENCOUNTER — Other Ambulatory Visit: Payer: Self-pay

## 2012-11-22 ENCOUNTER — Encounter: Payer: Self-pay | Admitting: Internal Medicine

## 2012-11-22 ENCOUNTER — Ambulatory Visit (INDEPENDENT_AMBULATORY_CARE_PROVIDER_SITE_OTHER): Payer: 59 | Admitting: Internal Medicine

## 2012-11-22 VITALS — BP 124/80 | HR 76 | Temp 97.5°F | Resp 16 | Ht 61.0 in | Wt 134.0 lb

## 2012-11-22 DIAGNOSIS — R011 Cardiac murmur, unspecified: Secondary | ICD-10-CM

## 2012-11-22 DIAGNOSIS — E559 Vitamin D deficiency, unspecified: Secondary | ICD-10-CM

## 2012-11-22 DIAGNOSIS — R002 Palpitations: Secondary | ICD-10-CM

## 2012-11-22 DIAGNOSIS — L259 Unspecified contact dermatitis, unspecified cause: Secondary | ICD-10-CM

## 2012-11-22 DIAGNOSIS — L309 Dermatitis, unspecified: Secondary | ICD-10-CM

## 2012-11-22 DIAGNOSIS — F411 Generalized anxiety disorder: Secondary | ICD-10-CM

## 2012-11-22 DIAGNOSIS — F419 Anxiety disorder, unspecified: Secondary | ICD-10-CM

## 2012-11-22 DIAGNOSIS — Z0000 Encounter for general adult medical examination without abnormal findings: Secondary | ICD-10-CM

## 2012-11-22 DIAGNOSIS — E538 Deficiency of other specified B group vitamins: Secondary | ICD-10-CM

## 2012-11-22 DIAGNOSIS — J069 Acute upper respiratory infection, unspecified: Secondary | ICD-10-CM

## 2012-11-22 DIAGNOSIS — R635 Abnormal weight gain: Secondary | ICD-10-CM

## 2012-11-22 MED ORDER — AZITHROMYCIN 250 MG PO TABS: ORAL_TABLET | ORAL | Status: DC

## 2012-11-22 MED ORDER — METOPROLOL TARTRATE 50 MG PO TABS: 50.0000 mg | ORAL_TABLET | Freq: Two times a day (BID) | ORAL | Status: AC

## 2012-11-22 MED ORDER — VALACYCLOVIR HCL 500 MG PO TABS: 500.0000 mg | ORAL_TABLET | Freq: Two times a day (BID) | ORAL | Status: AC

## 2012-11-22 MED ORDER — ZOLPIDEM TARTRATE 5 MG PO TABS: 5.0000 mg | ORAL_TABLET | Freq: Every evening | ORAL | Status: DC | PRN

## 2012-11-22 MED ORDER — TRIAMCINOLONE ACETONIDE 0.5 % EX CREA: TOPICAL_CREAM | Freq: Three times a day (TID) | CUTANEOUS | Status: AC

## 2012-11-22 MED ORDER — NITROFURANTOIN MONOHYD MACRO 100 MG PO CAPS: 100.0000 mg | ORAL_CAPSULE | Freq: Two times a day (BID) | ORAL | Status: DC

## 2012-11-22 NOTE — Assessment & Plan Note (Signed)
Zpac 

## 2012-11-22 NOTE — Assessment & Plan Note (Signed)
Triamc prn 

## 2012-11-22 NOTE — Progress Notes (Signed)
   Subjective:    HPI   The patient is here for a wellness exam.  F/u rash on back itchy  F/u palpitations - worse w/stress and travel; c/o anxiety, insomnia when away from home  F/u rhinitis, occ asthma, cold sores, constipation. She did transfered to [place] x 3 years. 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  HENT: Negative for congestion, mouth sores and sinus pressure.   Eyes: Negative for visual disturbance.  Respiratory: Negative for cough and chest tightness.   Cardiovascular: Positive for palpitations. Negative for chest pain and leg swelling.  Gastrointestinal: Negative for nausea and abdominal pain.  Genitourinary: Negative for frequency, difficulty urinating and vaginal pain.  Musculoskeletal: Negative for back pain and gait problem.  Skin: Negative for pallor and rash.  Neurological: Negative for dizziness, tremors, weakness, numbness and headaches.  Psychiatric/Behavioral: Negative for suicidal ideas, confusion and sleep disturbance. The patient is nervous/anxious.        Objective:   Physical Exam  Constitutional: She appears well-developed. No distress.  Neck: No thyromegaly present.  Cardiovascular: Normal rate and regular rhythm.  Exam reveals no gallop and no friction rub.   No murmur heard. Pulmonary/Chest: She has no wheezes. She has no rales.  Abdominal: There is no tenderness.  Musculoskeletal: She exhibits tenderness.  R elbow is tender medially over epicondyl and R extensors carpi  Neurological: She displays normal reflexes. She exhibits normal muscle tone.  Skin: No rash noted. No erythema.  Psychiatric: She  has a normal mood and affect. Her behavior is normal. Judgment and thought content normal.    Lab Results  Component Value Date   WBC 6.1 12/01/2011   HGB 14.6 12/01/2011   HCT 43.0 12/01/2011   PLT 291.0 12/01/2011   GLUCOSE 94 12/01/2011   CHOL 207* 12/01/2011   TRIG 140.0 12/01/2011   HDL 60.20 12/01/2011   LDLDIRECT 131.9 12/01/2011   ALT 21 12/01/2011   AST 20 12/01/2011   NA 136 12/01/2011   K 4.4 12/01/2011   CL 104 12/01/2011   CREATININE 0.6 12/01/2011   BUN 12 12/01/2011   CO2 25 12/01/2011   TSH 1.70 12/01/2011         Assessment & Plan:

## 2012-11-22 NOTE — Assessment & Plan Note (Signed)
Worse - stress related. S/p Card consult in 2011 Re-start Metoprolol

## 2012-11-22 NOTE — Assessment & Plan Note (Signed)
Discussed.

## 2012-11-22 NOTE — Progress Notes (Signed)
Pre visit review using our clinic review tool, if applicable. No additional management support is needed unless otherwise documented below in the visit note. 

## 2012-11-22 NOTE — Assessment & Plan Note (Signed)
Continue with current prescription therapy as reflected on the Med list.  

## 2012-11-22 NOTE — Assessment & Plan Note (Signed)
We discussed age appropriate health related issues, including available/recomended screening tests and vaccinations. We discussed a need for adhering to healthy diet and exercise. Labs/EKG were reviewed/ordered. All questions were answered. Dr Audie Box Colon due soon Declined all shot

## 2012-11-22 NOTE — Assessment & Plan Note (Signed)
Wt Readings from Last 3 Encounters:  11/22/12 134 lb (60.782 kg)  08/17/12 130 lb (58.968 kg)  12/01/11 124 lb (56.246 kg)

## 2012-11-23 ENCOUNTER — Other Ambulatory Visit (INDEPENDENT_AMBULATORY_CARE_PROVIDER_SITE_OTHER): Payer: 59

## 2012-11-23 DIAGNOSIS — E559 Vitamin D deficiency, unspecified: Secondary | ICD-10-CM

## 2012-11-23 DIAGNOSIS — Z0000 Encounter for general adult medical examination without abnormal findings: Secondary | ICD-10-CM

## 2012-11-23 DIAGNOSIS — E538 Deficiency of other specified B group vitamins: Secondary | ICD-10-CM

## 2012-11-23 LAB — URINALYSIS, ROUTINE W REFLEX MICROSCOPIC
Bilirubin Urine: NEGATIVE
Nitrite: NEGATIVE
Urobilinogen, UA: 0.2 (ref 0.0–1.0)

## 2012-11-23 LAB — LIPID PANEL
Cholesterol: 243 mg/dL — ABNORMAL HIGH (ref 0–200)
HDL: 69.5 mg/dL (ref >39.00)
Total CHOL/HDL Ratio: 3
Triglycerides: 141 mg/dL (ref 0.0–149.0)
VLDL: 28.2 mg/dL (ref 0.0–40.0)

## 2012-11-23 LAB — BASIC METABOLIC PANEL
BUN: 12 mg/dL (ref 6–23)
Calcium: 9.5 mg/dL (ref 8.4–10.5)
GFR: 108.62 mL/min (ref >60.00)
Glucose, Bld: 94 mg/dL (ref 70–99)
Potassium: 3.9 mEq/L (ref 3.5–5.1)
Sodium: 137 mEq/L (ref 135–145)

## 2012-11-23 LAB — CBC WITH DIFFERENTIAL/PLATELET
Basophils Absolute: 0 10*3/uL (ref 0.0–0.1)
Eosinophils Absolute: 0.2 10*3/uL (ref 0.0–0.7)
Eosinophils Relative: 3 % (ref 0.0–5.0)
HCT: 44.5 % (ref 36.0–46.0)
Hemoglobin: 15.3 g/dL — ABNORMAL HIGH (ref 12.0–15.0)
Lymphocytes Relative: 39.1 % (ref 12.0–46.0)
Lymphs Abs: 2 10*3/uL (ref 0.7–4.0)
MCV: 91.2 fl (ref 78.0–100.0)
Monocytes Relative: 6.8 % (ref 3.0–12.0)
Neutro Abs: 2.5 10*3/uL (ref 1.4–7.7)
Platelets: 225 10*3/uL (ref 150.0–400.0)
RDW: 13.2 % (ref 11.5–14.6)
WBC: 5 10*3/uL (ref 4.5–10.5)

## 2012-11-23 LAB — HEPATIC FUNCTION PANEL
ALT: 47 U/L — ABNORMAL HIGH (ref 0–35)
AST: 34 U/L (ref 0–37)
Albumin: 4.4 g/dL (ref 3.5–5.2)
Bilirubin, Direct: 0.2 mg/dL (ref 0.0–0.3)
Total Bilirubin: 2.2 mg/dL — ABNORMAL HIGH (ref 0.3–1.2)
Total Protein: 7.6 g/dL (ref 6.0–8.3)

## 2012-11-23 LAB — VITAMIN B12: Vitamin B-12: 333 pg/mL (ref 211–911)

## 2012-12-06 DIAGNOSIS — D219 Benign neoplasm of connective and other soft tissue, unspecified: Secondary | ICD-10-CM

## 2012-12-06 HISTORY — DX: Benign neoplasm of connective and other soft tissue, unspecified: D21.9

## 2012-12-20 ENCOUNTER — Telehealth: Payer: Self-pay | Admitting: *Deleted

## 2012-12-20 DIAGNOSIS — N951 Menopausal and female climacteric states: Secondary | ICD-10-CM

## 2012-12-20 NOTE — Telephone Encounter (Signed)
Front desk informed, pt informed. Order placed.

## 2012-12-20 NOTE — Telephone Encounter (Signed)
Schedule sonohysterogram for when she is back in the country. Stay on the hormones for now

## 2012-12-20 NOTE — Telephone Encounter (Signed)
Pt was given Activella 0.5/1 on OV 08/17/12 for first 3 months no issues, but this month she noticed some breast tenderness, light spotting. In [place] now will be back next week. Pt asked if normal? OV? Watch for now? Please advise

## 2012-12-27 ENCOUNTER — Other Ambulatory Visit: Payer: Self-pay | Admitting: Gynecology

## 2012-12-27 DIAGNOSIS — N950 Postmenopausal bleeding: Secondary | ICD-10-CM

## 2012-12-27 DIAGNOSIS — N951 Menopausal and female climacteric states: Secondary | ICD-10-CM

## 2012-12-29 ENCOUNTER — Ambulatory Visit (INDEPENDENT_AMBULATORY_CARE_PROVIDER_SITE_OTHER): Payer: 59 | Admitting: Gynecology

## 2012-12-29 ENCOUNTER — Encounter: Payer: Self-pay | Admitting: Gynecology

## 2012-12-29 ENCOUNTER — Other Ambulatory Visit: Payer: Self-pay | Admitting: Gynecology

## 2012-12-29 ENCOUNTER — Ambulatory Visit (INDEPENDENT_AMBULATORY_CARE_PROVIDER_SITE_OTHER): Payer: 59

## 2012-12-29 DIAGNOSIS — D251 Intramural leiomyoma of uterus: Secondary | ICD-10-CM

## 2012-12-29 DIAGNOSIS — N8300 Follicular cyst of ovary, unspecified side: Secondary | ICD-10-CM

## 2012-12-29 DIAGNOSIS — N950 Postmenopausal bleeding: Secondary | ICD-10-CM

## 2012-12-29 DIAGNOSIS — N83339 Acquired atrophy of ovary and fallopian tube, unspecified side: Secondary | ICD-10-CM

## 2012-12-29 DIAGNOSIS — N951 Menopausal and female climacteric states: Secondary | ICD-10-CM

## 2012-12-29 DIAGNOSIS — D259 Leiomyoma of uterus, unspecified: Secondary | ICD-10-CM

## 2012-12-29 NOTE — Patient Instructions (Signed)
Call me if significant menopausal symptoms continue or irregular bleeding continues.

## 2012-12-29 NOTE — Progress Notes (Signed)
Patient presents for sonohysterogram. She had called after 4 months of Activella 0.5/0.1 with spotting. She notes at that time that her breasts were very tender. She was told to followup for sonohysterogram.  Ultrasound shows uterus normal size. 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 Endometrial echo 3.7 mm. Right and left ovaries normal noting an echo-free simple follicle left ovary 19 mm. Cul-de-sac negative.  Sonohysterogram performed, sterile technique, easy catheter introduction, good distention with no abnormalities. Endometrial sample taken. Patient tolerated well.  Assessment and plan: Patient on HRT with episode of bleeding. Small follicle in left ovary with breast tenderness questionable escape ovulation. Thin endometrium possible atrophic bleeding also. So histograms normal with the exception of small myomas. Patient will followup for endometrial biopsy results. Assuming negative options to: 1. Continue on Activella as prescribed 2. Stopped HRT and see how she feels altogether reinitiate if significant menopausal symptoms, stay off if does well without. 3. Increased to Activella 0.1 to increase her estrogen to stabilize the endometrium either immediately or if irregular bleeding continues on the lower dose. After a discussion the patient wants to stop for now and see how she feels. She'll call me if she has significant symptoms and wants to reinitiate HRT.

## 2013-03-03 ENCOUNTER — Other Ambulatory Visit (HOSPITAL_COMMUNITY): Payer: Self-pay

## 2013-05-02 ENCOUNTER — Ambulatory Visit (HOSPITAL_COMMUNITY): Payer: 59 | Attending: Internal Medicine | Admitting: Radiology

## 2013-05-02 ENCOUNTER — Encounter: Payer: Self-pay | Admitting: Internal Medicine

## 2013-05-02 DIAGNOSIS — R9431 Abnormal electrocardiogram [ECG] [EKG]: Secondary | ICD-10-CM

## 2013-05-02 DIAGNOSIS — R002 Palpitations: Secondary | ICD-10-CM

## 2013-05-02 DIAGNOSIS — R011 Cardiac murmur, unspecified: Secondary | ICD-10-CM

## 2013-05-02 NOTE — Progress Notes (Signed)
Echocardiogram performed.  

## 2013-05-03 ENCOUNTER — Encounter: Payer: Self-pay | Admitting: Gynecology

## 2013-05-04 ENCOUNTER — Ambulatory Visit (INDEPENDENT_AMBULATORY_CARE_PROVIDER_SITE_OTHER): Payer: 59 | Admitting: Internal Medicine

## 2013-05-04 ENCOUNTER — Other Ambulatory Visit (INDEPENDENT_AMBULATORY_CARE_PROVIDER_SITE_OTHER): Payer: 59

## 2013-05-04 ENCOUNTER — Encounter: Payer: Self-pay | Admitting: Internal Medicine

## 2013-05-04 VITALS — BP 160/90 | HR 80 | Temp 97.5°F | Resp 16 | Wt 122.0 lb

## 2013-05-04 DIAGNOSIS — K5909 Other constipation: Secondary | ICD-10-CM

## 2013-05-04 DIAGNOSIS — E538 Deficiency of other specified B group vitamins: Secondary | ICD-10-CM

## 2013-05-04 DIAGNOSIS — F419 Anxiety disorder, unspecified: Secondary | ICD-10-CM

## 2013-05-04 DIAGNOSIS — J45909 Unspecified asthma, uncomplicated: Secondary | ICD-10-CM

## 2013-05-04 DIAGNOSIS — E559 Vitamin D deficiency, unspecified: Secondary | ICD-10-CM

## 2013-05-04 DIAGNOSIS — F411 Generalized anxiety disorder: Secondary | ICD-10-CM

## 2013-05-04 LAB — URINALYSIS
Bilirubin Urine: NEGATIVE
KETONES UR: NEGATIVE
Leukocytes, UA: NEGATIVE
Nitrite: NEGATIVE
PH: 7 (ref 5.0–8.0)
SPECIFIC GRAVITY, URINE: 1.01 (ref 1.000–1.030)
Total Protein, Urine: NEGATIVE
URINE GLUCOSE: NEGATIVE
Urobilinogen, UA: 0.2 (ref 0.0–1.0)

## 2013-05-04 LAB — BASIC METABOLIC PANEL
BUN: 17 mg/dL (ref 6–23)
CALCIUM: 10 mg/dL (ref 8.4–10.5)
CO2: 29 meq/L (ref 19–32)
Chloride: 101 mEq/L (ref 96–112)
Creatinine, Ser: 0.8 mg/dL (ref 0.4–1.2)
GFR: 82.88 mL/min (ref >60.00)
Glucose, Bld: 108 mg/dL — ABNORMAL HIGH (ref 70–99)
Potassium: 4.4 mEq/L (ref 3.5–5.1)
SODIUM: 139 meq/L (ref 135–145)

## 2013-05-04 LAB — CBC WITH DIFFERENTIAL/PLATELET
BASOS ABS: 0 10*3/uL (ref 0.0–0.1)
Basophils Relative: 0.1 % (ref 0.0–3.0)
Eosinophils Absolute: 0.2 10*3/uL (ref 0.0–0.7)
Eosinophils Relative: 3.1 % (ref 0.0–5.0)
HCT: 47.3 % — ABNORMAL HIGH (ref 36.0–46.0)
Hemoglobin: 15.9 g/dL — ABNORMAL HIGH (ref 12.0–15.0)
Lymphocytes Relative: 29.8 % (ref 12.0–46.0)
Lymphs Abs: 2.4 10*3/uL (ref 0.7–4.0)
MCHC: 33.6 g/dL (ref 30.0–36.0)
MCV: 93.7 fl (ref 78.0–100.0)
MONO ABS: 0.6 10*3/uL (ref 0.1–1.0)
MONOS PCT: 6.9 % (ref 3.0–12.0)
Neutro Abs: 4.8 10*3/uL (ref 1.4–7.7)
Neutrophils Relative %: 60.1 % (ref 43.0–77.0)
PLATELETS: 286 10*3/uL (ref 150.0–400.0)
RBC: 5.05 Mil/uL (ref 3.87–5.11)
RDW: 13.2 % (ref 11.5–14.6)
WBC: 7.9 10*3/uL (ref 4.5–10.5)

## 2013-05-04 LAB — HEPATIC FUNCTION PANEL
ALK PHOS: 57 U/L (ref 39–117)
ALT: 25 U/L (ref 0–35)
AST: 20 U/L (ref 0–37)
Albumin: 4.9 g/dL (ref 3.5–5.2)
BILIRUBIN DIRECT: 0.2 mg/dL (ref 0.0–0.3)
Total Bilirubin: 1.4 mg/dL — ABNORMAL HIGH (ref 0.3–1.2)
Total Protein: 8 g/dL (ref 6.0–8.3)

## 2013-05-04 MED ORDER — CLONAZEPAM 0.25 MG PO TBDP: 0.2500 mg | ORAL_TABLET | Freq: Two times a day (BID) | ORAL | Status: DC | PRN

## 2013-05-04 MED ORDER — PROMETHAZINE HCL 25 MG PO TABS: 25.0000 mg | ORAL_TABLET | Freq: Three times a day (TID) | ORAL | Status: DC | PRN

## 2013-05-04 NOTE — Assessment & Plan Note (Signed)
Continue with current prescription therapy as reflected on the Med list.  

## 2013-05-04 NOTE — Assessment & Plan Note (Signed)
Worse Continue with current prescription therapy as reflected on the Med list.  

## 2013-05-04 NOTE — Assessment & Plan Note (Signed)
Linzess prn 

## 2013-05-04 NOTE — Progress Notes (Signed)
Pre visit review using our clinic review tool, if applicable. No additional management support is needed unless otherwise documented below in the visit note. 

## 2013-05-04 NOTE — Progress Notes (Signed)
   Subjective:    HPI  C/o stress - job moved her F/u rash on back itchy  F/u palpitations - worse w/stress and travel; c/o anxiety, insomnia when away from home  F/u rhinitis, occ asthma, cold sores, constipation. She did transfered to [place] x 3 years. Working 14 h/days  F/u asthma  Wt Readings from Last 3 Encounters:  05/04/13 122 lb (55.339 kg)  11/22/12 134 lb (60.782 kg)  08/17/12 130 lb (58.968 kg)   BP Readings from Last 3 Encounters:  05/04/13 160/90  11/22/12 124/80  08/17/12 120/76      Review of Systems  Constitutional: Negative for chills, activity change, appetite change, fatigue and unexpected weight change.  HENT: Negative for congestion, mouth sores and sinus pressure.   Eyes: Negative for visual disturbance.  Respiratory: Negative for cough and chest tightness.   Cardiovascular: Positive for palpitations. Negative for chest pain and leg swelling.  Gastrointestinal: Negative for nausea and abdominal pain.  Genitourinary: Negative for frequency, difficulty urinating and vaginal pain.  Musculoskeletal: Negative for back pain and gait problem.  Skin: Negative for pallor and rash.  Neurological: Negative for dizziness, tremors, weakness, numbness and headaches.  Psychiatric/Behavioral: Negative for suicidal ideas, confusion and sleep disturbance. The patient is nervous/anxious.        Objective:   Physical Exam  Constitutional: She appears well-developed. No distress.  Neck: No thyromegaly present.  Cardiovascular: Normal rate and regular rhythm.  Exam reveals no gallop and no friction rub.   No murmur heard. Pulmonary/Chest: She has no wheezes. She has no rales.  Abdominal: There is no tenderness.  Musculoskeletal: She exhibits tenderness.  R elbow is tender medially over epicondyl and R extensors carpi  Neurological: She displays normal reflexes. She exhibits normal muscle tone.  Skin: No rash noted. No erythema.  Psychiatric: She has a normal  mood and affect. Her behavior is normal. Judgment and thought content normal.    Lab Results  Component Value Date   WBC 5.0 11/23/2012   HGB 15.3* 11/23/2012   HCT 44.5 11/23/2012   PLT 225.0 11/23/2012   GLUCOSE 94 11/23/2012   CHOL 243* 11/23/2012   TRIG 141.0 11/23/2012   HDL 69.50 11/23/2012   LDLDIRECT 155.4 11/23/2012   ALT 47* 11/23/2012   AST 34 11/23/2012   NA 137 11/23/2012   K 3.9 11/23/2012   CL 101 11/23/2012   CREATININE 0.6 11/23/2012   BUN 12 11/23/2012   CO2 28 11/23/2012   TSH 1.70 12/01/2011         Assessment & Plan:

## 2013-05-05 LAB — TSH: TSH: 1.76 u[IU]/mL (ref 0.35–5.50)

## 2013-05-05 LAB — VITAMIN B12: Vitamin B-12: 420 pg/mL (ref 211–911)

## 2013-09-28 ENCOUNTER — Other Ambulatory Visit: Payer: Self-pay | Admitting: Internal Medicine

## 2013-09-30 MED ORDER — ZOLPIDEM TARTRATE 5 MG PO TABS
ORAL_TABLET | ORAL | Status: DC
Start: 2013-09-30 — End: 2014-08-10

## 2013-09-30 NOTE — Telephone Encounter (Signed)
Re-printed script & fax [facility] to [facility].../lmb

## 2013-09-30 NOTE — Addendum Note (Signed)
Addended by: Earnstine Regal on: 09/30/2013 09:02 AM   Modules accepted: Orders

## 2013-10-01 ENCOUNTER — Other Ambulatory Visit: Payer: Self-pay | Admitting: Internal Medicine

## 2013-10-21 ENCOUNTER — Other Ambulatory Visit: Payer: Self-pay

## 2013-11-07 ENCOUNTER — Encounter: Payer: Self-pay | Admitting: Internal Medicine

## 2014-01-03 ENCOUNTER — Telehealth: Payer: Self-pay | Admitting: Internal Medicine

## 2014-01-03 NOTE — Telephone Encounter (Signed)
Received records from [facility]. Sent to Dr. Alain Marion. 01/03/14/ss

## 2014-08-10 ENCOUNTER — Ambulatory Visit (INDEPENDENT_AMBULATORY_CARE_PROVIDER_SITE_OTHER): Payer: 59 | Admitting: Gynecology

## 2014-08-10 ENCOUNTER — Other Ambulatory Visit (HOSPITAL_COMMUNITY)
Admission: RE | Admit: 2014-08-10 | Discharge: 2014-08-10 | Disposition: A | Discharge status: Home / Self Care | Payer: 59 | Source: Ambulatory Visit | Attending: Gynecology | Admitting: Gynecology

## 2014-08-10 ENCOUNTER — Encounter: Payer: Self-pay | Admitting: Gynecology

## 2014-08-10 ENCOUNTER — Encounter: Payer: Self-pay | Admitting: Internal Medicine

## 2014-08-10 VITALS — BP 124/80 | Ht 62.0 in | Wt 130.0 lb

## 2014-08-10 DIAGNOSIS — Z01419 Encounter for gynecological examination (general) (routine) without abnormal findings: Secondary | ICD-10-CM | POA: Insufficient documentation

## 2014-08-10 DIAGNOSIS — N952 Postmenopausal atrophic vaginitis: Secondary | ICD-10-CM | POA: Diagnosis not present

## 2014-08-10 DIAGNOSIS — J209 Acute bronchitis, unspecified: Secondary | ICD-10-CM | POA: Diagnosis not present

## 2014-08-10 DIAGNOSIS — N951 Menopausal and female climacteric states: Secondary | ICD-10-CM

## 2014-08-10 LAB — CBC WITH DIFFERENTIAL/PLATELET
BASOS ABS: 0 10*3/uL (ref 0.0–0.1)
Basophils Relative: 1 % (ref 0–1)
EOS ABS: 0.3 10*3/uL (ref 0.0–0.7)
EOS PCT: 7 % — AB (ref 0–5)
HCT: 42.7 % (ref 36.0–46.0)
Hemoglobin: 14.6 g/dL (ref 12.0–15.0)
Lymphocytes Relative: 42 % (ref 12–46)
Lymphs Abs: 1.8 10*3/uL (ref 0.7–4.0)
MCH: 31 pg (ref 26.0–34.0)
MCHC: 34.2 g/dL (ref 30.0–36.0)
MCV: 90.7 fL (ref 78.0–100.0)
MONOS PCT: 13 % — AB (ref 3–12)
MPV: 8.6 fL (ref 8.6–12.4)
Monocytes Absolute: 0.5 10*3/uL (ref 0.1–1.0)
Neutro Abs: 1.6 10*3/uL — ABNORMAL LOW (ref 1.7–7.7)
Neutrophils Relative %: 37 % — ABNORMAL LOW (ref 43–77)
Platelets: 224 10*3/uL (ref 150–400)
RBC: 4.71 MIL/uL (ref 3.87–5.11)
RDW: 13.6 % (ref 11.5–15.5)
WBC: 4.2 10*3/uL (ref 4.0–10.5)

## 2014-08-10 LAB — LIPID PANEL
CHOL/HDL RATIO: 3.2 ratio (ref <=5.0)
CHOLESTEROL: 201 mg/dL — AB (ref 125–200)
HDL: 63 mg/dL (ref >=46)
LDL Cholesterol: 93 mg/dL (ref <130)
Triglycerides: 226 mg/dL — ABNORMAL HIGH (ref <150)
VLDL: 45 mg/dL — ABNORMAL HIGH (ref <30)

## 2014-08-10 LAB — COMPREHENSIVE METABOLIC PANEL
ALT: 25 U/L (ref 6–29)
AST: 25 U/L (ref 10–35)
Albumin: 4.3 g/dL (ref 3.6–5.1)
Alkaline Phosphatase: 68 U/L (ref 33–130)
BUN: 10 mg/dL (ref 7–25)
CALCIUM: 9.7 mg/dL (ref 8.6–10.4)
CHLORIDE: 103 mmol/L (ref 98–110)
CO2: 27 mmol/L (ref 20–31)
Creat: 0.74 mg/dL (ref 0.50–1.05)
Glucose, Bld: 94 mg/dL (ref 65–99)
Potassium: 4.2 mmol/L (ref 3.5–5.3)
Sodium: 146 mmol/L (ref 135–146)
Total Bilirubin: 1.5 mg/dL — ABNORMAL HIGH (ref 0.2–1.2)
Total Protein: 7.3 g/dL (ref 6.1–8.1)

## 2014-08-10 LAB — HM MAMMOGRAPHY: HM MAMMO: NEGATIVE

## 2014-08-10 LAB — TSH: TSH: 3.201 u[IU]/mL (ref 0.350–4.500)

## 2014-08-10 MED ORDER — NITROFURANTOIN MONOHYD MACRO 100 MG PO CAPS: 100.0000 mg | ORAL_CAPSULE | Freq: Two times a day (BID) | ORAL | Status: DC

## 2014-08-10 MED ORDER — AZITHROMYCIN 250 MG PO TABS: ORAL_TABLET | ORAL | Status: DC

## 2014-08-10 MED ORDER — ZOLPIDEM TARTRATE 5 MG PO TABS: ORAL_TABLET | ORAL | Status: DC

## 2014-08-10 MED ORDER — VALACYCLOVIR HCL 1 G PO TABS: ORAL_TABLET | ORAL | Status: DC

## 2014-08-10 NOTE — Progress Notes (Signed)
Amber Lowe Amber Lowe [birth year] [id number]        54 y.o.  G2P0011 for annual exam.  Several issues noted below.  Past medical history,surgical history, problem list, medications, allergies, family history and social history were all reviewed and documented as reviewed in the EPIC chart.  ROS:  Performed with pertinent positives and negatives included in the history, assessment and plan.   Additional significant findings :  Cough with sore throat as discussed below   Exam: [name] Filed Vitals:   08/10/14 1114  BP: 124/80  Height: 5\' 2"  (1.575 m)  Weight: 130 lb (58.968 kg)   General appearance:  Normal affect, orientation and appearance. Skin: Grossly normal HEENT: Without gross lesions.  Throat clear without evidence of pharyngitis.  No cervical or supraclavicular adenopathy. Thyroid normal.  Lungs:  Clear without wheezing, rales or rhonchi Cardiac: RR, without RMG Abdominal:  Soft, nontender, without masses, guarding, rebound, organomegaly or hernia Breasts:  Examined lying and sitting without masses, retractions, discharge or axillary adenopathy. Pelvic:  Ext/BUS/vagina with atrophic changes  Cervix with atrophic changes. Pap smear done  Uterus anteverted, normal size, shape and contour, midline and mobile nontender   Adnexa  Without masses or tenderness    Anus and perineum  Normal   Rectovaginal  Normal sphincter tone without palpated masses or tenderness.    Assessment/Plan:  54 y.o. G8P0011 female for annual exam.   1. Postmenopausal/atrophic genital changes. Transiently tried HRT 2014 but discontinued. She is having some hot flushes with night sweats. No vaginal bleeding. Options for management reviewed again to include OTC products such as soy based versus retry of HRT. Patient wants to try the OTC soy based products. Will follow up if she wants to consider HRT again. Patient knows to report any vaginal bleeding. 2. Patient notes sore throat with cough since flying back  from the [place]. No fever chills or sputum production. Exam is overall normal. We'll treat as an early bronchitis with a Z-Pak. Follow up if symptoms persist, worsen. 3. Pap smear 2014.  Pap smear repeated today. No history of significant abnormal Pap smears previously. 4. DEXA never. We'll plan further into the menopause. Increased calcium vitamin D reviewed. Check vitamin D level today. 5. Colonoscopy 2011. Repeat at their recommended interval. 6. 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 Patient's followed by Dr. Alain Marion. She did ask if I could check routine labs and will go ahead and check a comprehensive metabolic panel which will also check her liver functions. 7. History of herpes labialis. Patient asked if I could refill her Valtrex. Valtrex 2 g twice a day times first day of symptoms prescribed.  #16 with 2 refills provided. 8. Ambien 5 mg #90 with 4 refills provided at her request. Dr. Alain Marion normally prescribes this but she is going to have to fly out to the [place] before being able to see him and she has been on this for a number of years and I refilled this for her. 9. Macrobid 100 mg #14 prescription written to have available as she travels a lot in the event she develops a UTI. 10. Health maintenance. Routine CBC comprehensive metabolic panel lipid profile urinalysis TSH vitamin D ordered at her request and she is going to have to delay her appointment with Dr. Alain Marion. She will have these results available for him if needed. Follow up in one year, sooner as needed.   Anastasio Auerbach MD, 11:52 AM 08/10/2014

## 2014-08-10 NOTE — Patient Instructions (Signed)
You may obtain a copy of any labs that were done today by logging onto MyChart as outlined in the instructions provided with your AVS (after visit summary). The office will not call with normal lab results but certainly if there are any significant abnormalities then we will contact you.   Health Maintenance, Female A healthy lifestyle and preventative care can promote health and wellness.  Maintain regular health, dental, and eye exams.  Eat a healthy diet. Foods like vegetables, fruits, whole grains, low-fat dairy products, and lean protein foods contain the nutrients you need without too many calories. Decrease your intake of foods high in solid fats, added sugars, and salt. Get information about a proper diet from your caregiver, if necessary.  Regular physical exercise is one of the most important things you can do for your health. Most adults should get at least 150 minutes of moderate-intensity exercise (any activity that increases your heart rate and causes you to sweat) each week. In addition, most adults need muscle-strengthening exercises on 2 or more days a week.   Maintain a healthy weight. The body mass index (BMI) is a screening tool to identify possible weight problems. It provides an estimate of body fat based on height and weight. Your caregiver can help determine your BMI, and can help you achieve or maintain a healthy weight. For adults 20 years and older:  A BMI below 18.5 is considered underweight.  A BMI of 18.5 to 24.9 is normal.  A BMI of 25 to 29.9 is considered overweight.  A BMI of 30 and above is considered obese.  Maintain normal blood lipids and cholesterol by exercising and minimizing your intake of saturated fat. Eat a balanced diet with plenty of fruits and vegetables. Blood tests for lipids and cholesterol should begin at age 61 and be repeated every 5 years. If your lipid or cholesterol levels are high, you are over 50, or you are a high risk for heart  disease, you may need your cholesterol levels checked more frequently.Ongoing high lipid and cholesterol levels should be treated with medicines if diet and exercise are not effective.  If you smoke, find out from your caregiver how to quit. If you do not use tobacco, do not start.  Lung cancer screening is recommended for adults aged 33 80 years who are at high risk for developing lung cancer because of a history of smoking. Yearly low-dose computed tomography (CT) is recommended for people who have at least a 30-pack-year history of smoking and are a current smoker or have quit within the past 15 years. A pack year of smoking is smoking an average of 1 pack of cigarettes a day for 1 year (for example: 1 pack a day for 30 years or 2 packs a day for 15 years). Yearly screening should continue until the smoker has stopped smoking for at least 15 years. Yearly screening should also be stopped for people who develop a health problem that would prevent them from having lung cancer treatment.  If you are pregnant, do not drink alcohol. If you are breastfeeding, be very cautious about drinking alcohol. If you are not pregnant and choose to drink alcohol, do not exceed 1 drink per day. One drink is considered to be 12 ounces (355 mL) of beer, 5 ounces (148 mL) of wine, or 1.5 ounces (44 mL) of liquor.  Avoid use of street drugs. Do not share needles with anyone. Ask for help if you need support or instructions about stopping  the use of drugs.  High blood pressure causes heart disease and increases the risk of stroke. Blood pressure should be checked at least every 1 to 2 years. Ongoing high blood pressure should be treated with medicines, if weight loss and exercise are not effective.  If you are 59 to 56 years old, ask your caregiver if you should take aspirin to prevent strokes.  Diabetes screening involves taking a blood sample to check your fasting blood sugar level. This should be done once every 3  years, after age 91, if you are within normal weight and without risk factors for diabetes. Testing should be considered at a younger age or be carried out more frequently if you are overweight and have at least 1 risk factor for diabetes.  Breast cancer screening is essential preventative care for women. You should practice "breast self-awareness." This means understanding the normal appearance and feel of your breasts and may include breast self-examination. Any changes detected, no matter how small, should be reported to a caregiver. Women in their 66s and 30s should have a clinical breast exam (CBE) by a caregiver as part of a regular health exam every 1 to 3 years. After age 101, women should have a CBE every year. Starting at age 100, women should consider having a mammogram (breast X-ray) every year. Women who have a family history of breast cancer should talk to their caregiver about genetic screening. Women at a high risk of breast cancer should talk to their caregiver about having an MRI and a mammogram every year.  Breast cancer gene (BRCA)-related cancer risk assessment is recommended for women who have family members with BRCA-related cancers. BRCA-related cancers include breast, ovarian, tubal, and peritoneal cancers. Having family members with these cancers may be associated with an increased risk for harmful changes (mutations) in the breast cancer genes BRCA1 and BRCA2. Results of the assessment will determine the need for genetic counseling and BRCA1 and BRCA2 testing.  The Pap test is a screening test for cervical cancer. Women should have a Pap test starting at age 57. Between ages 25 and 35, Pap tests should be repeated every 2 years. Beginning at age 37, you should have a Pap test every 3 years as long as the past 3 Pap tests have been normal. If you had a hysterectomy for a problem that was not cancer or a condition that could lead to cancer, then you no longer need Pap tests. If you are  between ages 50 and 76, and you have had normal Pap tests going back 10 years, you no longer need Pap tests. If you have had past treatment for cervical cancer or a condition that could lead to cancer, you need Pap tests and screening for cancer for at least 20 years after your treatment. If Pap tests have been discontinued, risk factors (such as a new sexual partner) need to be reassessed to determine if screening should be resumed. Some women have medical problems that increase the chance of getting cervical cancer. In these cases, your caregiver may recommend more frequent screening and Pap tests.  The human papillomavirus (HPV) test is an additional test that may be used for cervical cancer screening. The HPV test looks for the virus that can cause the cell changes on the cervix. The cells collected during the Pap test can be tested for HPV. The HPV test could be used to screen women aged 44 years and older, and should be used in women of any age  who have unclear Pap test results. After the age of 55, women should have HPV testing at the same frequency as a Pap test.  Colorectal cancer can be detected and often prevented. Most routine colorectal cancer screening begins at the age of 44 and continues through age 20. However, your caregiver may recommend screening at an earlier age if you have risk factors for colon cancer. On a yearly basis, your caregiver may provide home test kits to check for hidden blood in the stool. Use of a small camera at the end of a tube, to directly examine the colon (sigmoidoscopy or colonoscopy), can detect the earliest forms of colorectal cancer. Talk to your caregiver about this at age 86, when routine screening begins. Direct examination of the colon should be repeated every 5 to 10 years through age 13, unless early forms of pre-cancerous polyps or small growths are found.  Hepatitis C blood testing is recommended for all people born from [birth year] through 1967 and any  individual with known risks for hepatitis C.  Practice safe sex. Use condoms and avoid high-risk sexual practices to reduce the spread of sexually transmitted infections (STIs). Sexually active women aged 36 and younger should be checked for Chlamydia, which is a common sexually transmitted infection. Older women with new or multiple partners should also be tested for Chlamydia. Testing for other STIs is recommended if you are sexually active and at increased risk.  Osteoporosis is a disease in which the bones lose minerals and strength with aging. This can result in serious bone fractures. The risk of osteoporosis can be identified using a bone density scan. Women ages 20 and over and women at risk for fractures or osteoporosis should discuss screening with their caregivers. Ask your caregiver whether you should be taking a calcium supplement or vitamin D to reduce the rate of osteoporosis.  Menopause can be associated with physical symptoms and risks. Hormone replacement therapy is available to decrease symptoms and risks. You should talk to your caregiver about whether hormone replacement therapy is right for you.  Use sunscreen. Apply sunscreen liberally and repeatedly throughout the day. You should seek shade when your shadow is shorter than you. Protect yourself by wearing long sleeves, pants, a wide-brimmed hat, and sunglasses year round, whenever you are outdoors.  Notify your caregiver of new moles or changes in moles, especially if there is a change in shape or color. Also notify your caregiver if a mole is larger than the size of a pencil eraser.  Stay current with your immunizations. Document Released: 07/08/2010 Document Revised: 04/19/2012 Document Reviewed: 07/08/2010 [facility] Patient Information 2014 [employer].

## 2014-08-11 LAB — URINALYSIS W MICROSCOPIC + REFLEX CULTURE
Bacteria, UA: NONE SEEN [HPF]
Bilirubin Urine: NEGATIVE
CASTS: NONE SEEN [LPF]
Crystals: NONE SEEN [HPF]
Glucose, UA: NEGATIVE
HGB URINE DIPSTICK: NEGATIVE
Ketones, ur: NEGATIVE
LEUKOCYTES UA: NEGATIVE
Nitrite: NEGATIVE
PH: 7 (ref 5.0–8.0)
Protein, ur: NEGATIVE
RBC / HPF: NONE SEEN RBC/HPF (ref <=2)
Specific Gravity, Urine: 1.004 (ref 1.001–1.035)
Squamous Epithelial / LPF: NONE SEEN [HPF] (ref <=5)
WBC UA: NONE SEEN WBC/HPF (ref <=5)
YEAST: NONE SEEN [HPF]

## 2014-08-11 LAB — VITAMIN D 25 HYDROXY (VIT D DEFICIENCY, FRACTURES): Vit D, 25-Hydroxy: 25 ng/mL — ABNORMAL LOW (ref 30–100)

## 2014-08-14 LAB — CYTOLOGY - PAP

## 2014-08-18 ENCOUNTER — Ambulatory Visit (INDEPENDENT_AMBULATORY_CARE_PROVIDER_SITE_OTHER): Payer: 59 | Admitting: Internal Medicine

## 2014-08-18 ENCOUNTER — Encounter: Payer: Self-pay | Admitting: Internal Medicine

## 2014-08-18 ENCOUNTER — Ambulatory Visit (INDEPENDENT_AMBULATORY_CARE_PROVIDER_SITE_OTHER)
Admission: RE | Admit: 2014-08-18 | Discharge: 2014-08-18 | Disposition: A | Discharge status: Home / Self Care | Payer: 59 | Source: Ambulatory Visit | Attending: Internal Medicine | Admitting: Internal Medicine

## 2014-08-18 VITALS — BP 124/86 | HR 75 | Temp 97.8°F | Wt 129.0 lb

## 2014-08-18 DIAGNOSIS — J4521 Mild intermittent asthma with (acute) exacerbation: Secondary | ICD-10-CM | POA: Diagnosis not present

## 2014-08-18 DIAGNOSIS — E538 Deficiency of other specified B group vitamins: Secondary | ICD-10-CM | POA: Diagnosis not present

## 2014-08-18 DIAGNOSIS — J069 Acute upper respiratory infection, unspecified: Secondary | ICD-10-CM

## 2014-08-18 MED ORDER — PROMETHAZINE-CODEINE 6.25-10 MG/5ML PO SYRP: 5.0000 mL | ORAL_SOLUTION | ORAL | Status: DC | PRN

## 2014-08-18 MED ORDER — ERGOCALCIFEROL 1.25 MG (50000 UT) PO CAPS: 50000.0000 [IU] | ORAL_CAPSULE | ORAL | Status: DC

## 2014-08-18 MED ORDER — VITAMIN D3 50 MCG (2000 UT) PO CAPS: 2000.0000 [IU] | ORAL_CAPSULE | Freq: Every day | ORAL | Status: DC

## 2014-08-18 MED ORDER — CEFUROXIME AXETIL 500 MG PO TABS: 500.0000 mg | ORAL_TABLET | Freq: Two times a day (BID) | ORAL | Status: DC

## 2014-08-18 MED ORDER — B COMPLEX VITAMINS PO CAPS: 1.0000 | ORAL_CAPSULE | Freq: Every day | ORAL | Status: AC

## 2014-08-18 MED ORDER — PROMETHAZINE HCL 25 MG PO TABS: 25.0000 mg | ORAL_TABLET | Freq: Four times a day (QID) | ORAL | Status: DC | PRN

## 2014-08-18 MED ORDER — CYANOCOBALAMIN 1000 MCG/ML IJ SOLN
1000.0000 ug | Freq: Once | INTRAMUSCULAR | Status: AC
  Administered 2014-08-18: 1000 ug via INTRAMUSCULAR

## 2014-08-18 MED ORDER — FLUCONAZOLE 150 MG PO TABS: 150.0000 mg | ORAL_TABLET | Freq: Once | ORAL | Status: DC

## 2014-08-18 MED ORDER — METHYLPREDNISOLONE ACETATE 80 MG/ML IJ SUSP
80.0000 mg | Freq: Once | INTRAMUSCULAR | Status: AC
  Administered 2014-08-18: 80 mg via INTRAMUSCULAR

## 2014-08-18 MED ORDER — FLUTICASONE-SALMETEROL 250-50 MCG/DOSE IN AEPB: 1.0000 | INHALATION_SPRAY | Freq: Two times a day (BID) | RESPIRATORY_TRACT | Status: DC

## 2014-08-18 NOTE — Progress Notes (Signed)
Pre visit review using our clinic review tool, if applicable. No additional management support is needed unless otherwise documented below in the visit note. 

## 2014-08-18 NOTE — Assessment & Plan Note (Signed)
CXR Ceftin 10 d  Diflucan

## 2014-08-18 NOTE — Progress Notes (Signed)
   Subjective:    HPI  C/o cough x 1 week, not better w/Zpac; yellow sputum, SOB, wheezing  F/u stress - job moved her to [place] x 3 years  F/u palpitations - worse w/stress and travel; c/o anxiety, insomnia when away from home  F/u rhinitis, occ asthma, cold sores, constipation. She did transfered to [place] x 3 years. Working 12 h/days  F/u asthma  Wt Readings from Last 3 Encounters:  08/18/14 129 lb (58.514 kg)  08/10/14 130 lb (58.968 kg)  05/04/13 122 lb (55.339 kg)   BP Readings from Last 3 Encounters:  08/18/14 124/86  08/10/14 124/80  05/04/13 160/90      Review of Systems  Constitutional: Negative for chills, activity change, appetite change, fatigue and unexpected weight change.  HENT: Negative for congestion, mouth sores and sinus pressure.   Eyes: Negative for visual disturbance.  Respiratory: Negative for cough and chest tightness.   Cardiovascular: Positive for palpitations. Negative for chest pain and leg swelling.  Gastrointestinal: Negative for nausea and abdominal pain.  Genitourinary: Negative for frequency, difficulty urinating and vaginal pain.  Musculoskeletal: Negative for back pain and gait problem.  Skin: Negative for pallor and rash.  Neurological: Negative for dizziness, tremors, weakness, numbness and headaches.  Psychiatric/Behavioral: Negative for suicidal ideas, confusion and sleep disturbance. The patient is nervous/anxious.        Objective:   Physical Exam  Constitutional: She appears well-developed. No distress.  Neck: No thyromegaly present.  Cardiovascular: Normal rate and regular rhythm.  Exam reveals no gallop and no friction rub.   No murmur heard. Pulmonary/Chest: She has no wheezes. She has no rales.  Abdominal: There is no tenderness.  Musculoskeletal: She exhibits tenderness.  R elbow is tender medially over epicondyl and R extensors carpi  Neurological: She displays normal reflexes. She exhibits normal muscle  tone.  Skin: No rash noted. No erythema.  Psychiatric: She has a normal mood and affect. Her behavior is normal. Judgment and thought content normal.    Lab Results  Component Value Date   WBC 4.2 08/10/2014   HGB 14.6 08/10/2014   HCT 42.7 08/10/2014   PLT 224 08/10/2014   GLUCOSE 94 08/10/2014   CHOL 201* 08/10/2014   TRIG 226* 08/10/2014   HDL 63 08/10/2014   LDLDIRECT 155.4 11/23/2012   LDLCALC 93 08/10/2014   ALT 25 08/10/2014   AST 25 08/10/2014   NA 146 08/10/2014   K 4.2 08/10/2014   CL 103 08/10/2014   CREATININE 0.74 08/10/2014   BUN 10 08/10/2014   CO2 27 08/10/2014   TSH 3.201 08/10/2014         Assessment & Plan:

## 2014-08-18 NOTE — Patient Instructions (Signed)
Use over-the-counter  "cold" medicines  such as "Tylenol cold" , "Advil cold",  "Mucinex" or" Mucinex D"  for cough and congestion.   Avoid decongestants if you have high blood pressure and use "Afrin" nasal spray for nasal congestion as directed instead. Use" Delsym" or" Robitussin" cough syrup varietis for cough.  You can use plain "Tylenol" or "Advil" for fever, chills and achyness. Use Halls or Ricola cough drops.  Please, make an appointment if you are not better or if you're worse.  

## 2014-08-18 NOTE — Assessment & Plan Note (Signed)
Advair Prom-Cod Depo-medrol

## 2015-05-17 ENCOUNTER — Other Ambulatory Visit (INDEPENDENT_AMBULATORY_CARE_PROVIDER_SITE_OTHER): Payer: 59

## 2015-05-17 ENCOUNTER — Encounter: Payer: Self-pay | Admitting: Internal Medicine

## 2015-05-17 ENCOUNTER — Ambulatory Visit (INDEPENDENT_AMBULATORY_CARE_PROVIDER_SITE_OTHER): Payer: 59 | Admitting: Internal Medicine

## 2015-05-17 VITALS — BP 118/80 | HR 72 | Ht 62.0 in | Wt 132.0 lb

## 2015-05-17 DIAGNOSIS — Z0000 Encounter for general adult medical examination without abnormal findings: Secondary | ICD-10-CM | POA: Diagnosis not present

## 2015-05-17 DIAGNOSIS — R0789 Other chest pain: Secondary | ICD-10-CM | POA: Insufficient documentation

## 2015-05-17 DIAGNOSIS — E538 Deficiency of other specified B group vitamins: Secondary | ICD-10-CM

## 2015-05-17 DIAGNOSIS — E559 Vitamin D deficiency, unspecified: Secondary | ICD-10-CM

## 2015-05-17 LAB — URINALYSIS, ROUTINE W REFLEX MICROSCOPIC
BILIRUBIN URINE: NEGATIVE
KETONES UR: NEGATIVE
NITRITE: NEGATIVE
RBC / HPF: NONE SEEN (ref 0–?)
Specific Gravity, Urine: <=1.005 — AB (ref 1.000–1.030)
TOTAL PROTEIN, URINE-UPE24: NEGATIVE
URINE GLUCOSE: NEGATIVE
UROBILINOGEN UA: 0.2 (ref 0.0–1.0)
pH: 6 (ref 5.0–8.0)

## 2015-05-17 LAB — BASIC METABOLIC PANEL
BUN: 15 mg/dL (ref 6–23)
CHLORIDE: 105 meq/L (ref 96–112)
CO2: 26 meq/L (ref 19–32)
Calcium: 10 mg/dL (ref 8.4–10.5)
Creatinine, Ser: 0.73 mg/dL (ref 0.40–1.20)
GFR: 87.49 mL/min (ref >60.00)
Glucose, Bld: 94 mg/dL (ref 70–99)
Potassium: 4.2 mEq/L (ref 3.5–5.1)
SODIUM: 143 meq/L (ref 135–145)

## 2015-05-17 LAB — CBC WITH DIFFERENTIAL/PLATELET
BASOS ABS: 0 10*3/uL (ref 0.0–0.1)
Basophils Relative: 0.4 % (ref 0.0–3.0)
EOS ABS: 0.2 10*3/uL (ref 0.0–0.7)
Eosinophils Relative: 2.1 % (ref 0.0–5.0)
HCT: 39.7 % (ref 36.0–46.0)
Hemoglobin: 13.2 g/dL (ref 12.0–15.0)
LYMPHS ABS: 2.6 10*3/uL (ref 0.7–4.0)
Lymphocytes Relative: 34.6 % (ref 12.0–46.0)
MCHC: 33.2 g/dL (ref 30.0–36.0)
MCV: 89.1 fl (ref 78.0–100.0)
MONO ABS: 0.7 10*3/uL (ref 0.1–1.0)
MONOS PCT: 9 % (ref 3.0–12.0)
NEUTROS ABS: 4 10*3/uL (ref 1.4–7.7)
NEUTROS PCT: 53.9 % (ref 43.0–77.0)
PLATELETS: 252 10*3/uL (ref 150.0–400.0)
RBC: 4.46 Mil/uL (ref 3.87–5.11)
RDW: 13.4 % (ref 11.5–15.5)
WBC: 7.4 10*3/uL (ref 4.0–10.5)

## 2015-05-17 LAB — HEPATIC FUNCTION PANEL
ALBUMIN: 4.6 g/dL (ref 3.5–5.2)
ALK PHOS: 60 U/L (ref 39–117)
ALT: 24 U/L (ref 0–35)
AST: 18 U/L (ref 0–37)
BILIRUBIN DIRECT: 0.2 mg/dL (ref 0.0–0.3)
TOTAL PROTEIN: 7.4 g/dL (ref 6.0–8.3)
Total Bilirubin: 1.3 mg/dL — ABNORMAL HIGH (ref 0.2–1.2)

## 2015-05-17 LAB — VITAMIN D 25 HYDROXY (VIT D DEFICIENCY, FRACTURES): VITD: 29.44 ng/mL — AB (ref 30.00–100.00)

## 2015-05-17 LAB — LIPID PANEL
CHOL/HDL RATIO: 4
CHOLESTEROL: 202 mg/dL — AB (ref 0–200)
HDL: 50 mg/dL (ref >39.00)
LDL Cholesterol: 124 mg/dL — ABNORMAL HIGH (ref 0–99)
NonHDL: 151.74
TRIGLYCERIDES: 137 mg/dL (ref 0.0–149.0)
VLDL: 27.4 mg/dL (ref 0.0–40.0)

## 2015-05-17 LAB — TSH: TSH: 2.28 u[IU]/mL (ref 0.35–4.50)

## 2015-05-17 LAB — VITAMIN B12: VITAMIN B 12: 500 pg/mL (ref 211–911)

## 2015-05-17 MED ORDER — PROMETHAZINE HCL 25 MG PO TABS: 25.0000 mg | ORAL_TABLET | Freq: Four times a day (QID) | ORAL | Status: DC | PRN

## 2015-05-17 MED ORDER — VALACYCLOVIR HCL 1 G PO TABS: ORAL_TABLET | ORAL | Status: DC

## 2015-05-17 MED ORDER — NITROFURANTOIN MONOHYD MACRO 100 MG PO CAPS: 100.0000 mg | ORAL_CAPSULE | Freq: Two times a day (BID) | ORAL | Status: DC

## 2015-05-17 MED ORDER — AZITHROMYCIN 250 MG PO TABS: ORAL_TABLET | ORAL | Status: DC

## 2015-05-17 MED ORDER — PROPRANOLOL HCL 10 MG PO TABS: 10.0000 mg | ORAL_TABLET | Freq: Two times a day (BID) | ORAL | Status: DC

## 2015-05-17 MED ORDER — ZOLPIDEM TARTRATE 5 MG PO TABS: ORAL_TABLET | ORAL | Status: DC

## 2015-05-17 NOTE — Assessment & Plan Note (Signed)
On Vit D 

## 2015-05-17 NOTE — Progress Notes (Signed)
Pre visit review using our clinic review tool, if applicable. No additional management support is needed unless otherwise documented below in the visit note. 

## 2015-05-17 NOTE — Assessment & Plan Note (Signed)
We discussed age appropriate health related issues, including available/recomended screening tests and vaccinations. We discussed a need for adhering to healthy diet and exercise. Labs/EKG were reviewed/ordered. All questions were answered. GYN - Dr Phineas Real Colon - Dr Collene Mares  Declined all shots

## 2015-05-17 NOTE — Progress Notes (Signed)
Subjective:  Patient ID: Amber Lowe, female    DOB: 1961-01-06  Age: 55 y.o. MRN: [id number]  CC: Annual Exam   HPI [facility] Amber Lowe presents for a well exam. C/o occ heaviness from time to time - poss stress related (months) 1-3 times a week; non-exertional... F/u cold sores, insomnia, international travel issues  Outpatient Prescriptions Prior to Visit  Medication Sig Dispense Refill  . b complex vitamins capsule Take 1 capsule by mouth daily. 100 capsule 3  . Cholecalciferol (VITAMIN D3) 2000 UNITS capsule Take 1 capsule (2,000 Units total) by mouth daily. 100 capsule 3  . ergocalciferol (VITAMIN D2) 50000 UNITS capsule Take 1 capsule (50,000 Units total) by mouth once a week. 6 capsule 0  . fluconazole (DIFLUCAN) 150 MG tablet Take 1 tablet (150 mg total) by mouth once. PRN yeast infection 3 tablet 1  . Fluticasone-Salmeterol (ADVAIR DISKUS) 250-50 MCG/DOSE AEPB Inhale 1 puff into the lungs 2 (two) times daily. 3 each 3  . promethazine (PHENERGAN) 25 MG tablet Take 1 tablet (25 mg total) by mouth every 6 (six) hours as needed for nausea or vomiting. 60 tablet 2  . valACYclovir (VALTREX) 1000 MG tablet Take 2 pills twice daily for one day at onset of cold sore symptoms 16 tablet 2  . zolpidem (AMBIEN) 5 MG tablet TAKE 1 TABLET AT BEDTIME AS NEEDED 90 tablet 3  . clonazePAM (KLONOPIN) 0.25 MG disintegrating tablet Take 1 tablet (0.25 mg total) by mouth 2 (two) times daily as needed. (Patient not taking: Reported on 05/17/2015) 60 tablet 1  . promethazine-codeine (PHENERGAN WITH CODEINE) 6.25-10 MG/5ML syrup Take 5 mLs by mouth every 4 (four) hours as needed. (Patient not taking: Reported on 05/17/2015) 300 mL 0  . cefUROXime (CEFTIN) 500 MG tablet Take 1 tablet (500 mg total) by mouth 2 (two) times daily. (Patient not taking: Reported on 05/17/2015) 20 tablet 0   Facility-Administered Medications Prior to Visit  Medication Dose Route Frequency Provider Last Rate Last Dose  .  methylPREDNISolone acetate (DEPO-MEDROL) injection 40 mg  40 mg Intra-articular Once Ray Gervasi V, MD        ROS Review of Systems  Constitutional: Negative for chills, activity change, appetite change, fatigue and unexpected weight change.  HENT: Negative for congestion, ear pain, mouth sores and sinus pressure.   Eyes: Negative for photophobia and visual disturbance.  Respiratory: Negative for cough and chest tightness.   Cardiovascular: Positive for chest pain. Negative for palpitations and leg swelling.  Gastrointestinal: Negative for nausea and abdominal pain.  Genitourinary: Negative for urgency, frequency, difficulty urinating and vaginal pain.  Musculoskeletal: Negative for back pain, gait problem and neck pain.  Skin: Negative for pallor and rash.  Neurological: Negative for dizziness, tremors, weakness, numbness and headaches.  Psychiatric/Behavioral: Positive for sleep disturbance. Negative for confusion. The patient is nervous/anxious.     Objective:  BP 118/80 mmHg  Pulse 72  Ht 5\' 2"  (1.575 m)  Wt 132 lb (59.875 kg)  BMI 24.14 kg/m2  SpO2 98%  LMP 06/19/2012  BP Readings from Last 3 Encounters:  05/17/15 118/80  08/18/14 124/86  08/10/14 124/80    Wt Readings from Last 3 Encounters:  05/17/15 132 lb (59.875 kg)  08/18/14 129 lb (58.514 kg)  08/10/14 130 lb (58.968 kg)    Physical Exam  Constitutional: She appears well-developed. No distress.  HENT:  Head: Normocephalic.  Right Ear: External ear normal.  Left Ear: External ear normal.  Nose: Nose normal.  Mouth/Throat: Oropharynx is clear and moist.  Eyes: Conjunctivae are normal. Pupils are equal, round, and reactive to light. Right eye exhibits no discharge. Left eye exhibits no discharge.  Neck: Normal range of motion. Neck supple. No JVD present. No tracheal deviation present. No thyromegaly present.  Cardiovascular: Normal rate, regular rhythm and normal heart sounds.  Exam reveals no friction  rub.   No murmur heard. Pulmonary/Chest: No stridor. No respiratory distress. She has no wheezes.  Abdominal: Soft. Bowel sounds are normal. She exhibits no distension and no mass. There is no tenderness. There is no rebound and no guarding.  Musculoskeletal: She exhibits no edema or tenderness.  Lymphadenopathy:    She has no cervical adenopathy.  Neurological: She displays normal reflexes. No cranial nerve deficit. She exhibits normal muscle tone. Coordination normal.  Skin: No rash noted. No erythema.  Psychiatric: She has a normal mood and affect. Her behavior is normal. Judgment and thought content normal.   Procedure: EKG Indication: chest pain Impression: NSR. No new changes since 2014  2015 ECHO - ok  Lab Results  Component Value Date   WBC 4.2 08/10/2014   HGB 14.6 08/10/2014   HCT 42.7 08/10/2014   PLT 224 08/10/2014   GLUCOSE 94 08/10/2014   CHOL 201* 08/10/2014   TRIG 226* 08/10/2014   HDL 63 08/10/2014   LDLDIRECT 155.4 11/23/2012   LDLCALC 93 08/10/2014   ALT 25 08/10/2014   AST 25 08/10/2014   NA 146 08/10/2014   K 4.2 08/10/2014   CL 103 08/10/2014   CREATININE 0.74 08/10/2014   BUN 10 08/10/2014   CO2 27 08/10/2014   TSH 3.201 08/10/2014    Dg Chest 2 View  08/18/2014  CLINICAL DATA:  55 year old female with history of cough and congestion for 1 week. EXAM: CHEST - 2 VIEW COMPARISON:  05/21/2011, 05/15/2009 FINDINGS: Cardiomediastinal silhouette projects within normal limits in size and contour. No confluent airspace disease, pneumothorax, or pleural effusion. No displaced fracture. Unremarkable appearance of the upper abdomen. IMPRESSION: No radiographic evidence of acute cardiopulmonary disease. Signed, Amber Lowe. Earleen Newport, DO [facility] Electronically Signed   By: Corrie Mckusick D.O.   On: 08/18/2014 09:03    Assessment & Plan:   There are no diagnoses linked to this encounter. I have  discontinued Amber Lowe's cefUROXime. I am also having her maintain her clonazePAM, zolpidem, valACYclovir, promethazine, Fluticasone-Salmeterol, fluconazole, promethazine-codeine, ergocalciferol, b complex vitamins, and Vitamin D3. We will continue to administer methylPREDNISolone acetate.  No orders of the defined types were placed in this encounter.     Follow-up: No Follow-up on file.  Walker Kehr, MD

## 2015-05-17 NOTE — Assessment & Plan Note (Signed)
Recurrent 2017 - poss due to stress EKG - no change Stress test was suggested. The pt will let me know when she can do it

## 2015-05-17 NOTE — Assessment & Plan Note (Signed)
On B12 

## 2015-05-18 LAB — HEPATITIS C ANTIBODY: HCV Ab: NEGATIVE

## 2016-01-16 ENCOUNTER — Other Ambulatory Visit: Payer: Self-pay | Admitting: Internal Medicine

## 2016-01-16 DIAGNOSIS — Z1231 Encounter for screening mammogram for malignant neoplasm of breast: Secondary | ICD-10-CM

## 2016-02-12 ENCOUNTER — Other Ambulatory Visit: Payer: Self-pay | Admitting: Internal Medicine

## 2016-02-14 ENCOUNTER — Ambulatory Visit (INDEPENDENT_AMBULATORY_CARE_PROVIDER_SITE_OTHER): Payer: 59 | Admitting: Gynecology

## 2016-02-14 ENCOUNTER — Encounter: Payer: Self-pay | Admitting: Gynecology

## 2016-02-14 ENCOUNTER — Encounter: Payer: Self-pay | Admitting: Internal Medicine

## 2016-02-14 ENCOUNTER — Ambulatory Visit (INDEPENDENT_AMBULATORY_CARE_PROVIDER_SITE_OTHER): Payer: 59 | Admitting: Internal Medicine

## 2016-02-14 ENCOUNTER — Other Ambulatory Visit (INDEPENDENT_AMBULATORY_CARE_PROVIDER_SITE_OTHER): Payer: 59

## 2016-02-14 VITALS — BP 116/74 | HR 68 | Temp 98.2°F | Resp 20 | Wt 119.0 lb

## 2016-02-14 VITALS — BP 114/74 | Ht 63.0 in | Wt 119.0 lb

## 2016-02-14 DIAGNOSIS — F4329 Adjustment disorder with other symptoms: Secondary | ICD-10-CM | POA: Diagnosis not present

## 2016-02-14 DIAGNOSIS — Z01411 Encounter for gynecological examination (general) (routine) with abnormal findings: Secondary | ICD-10-CM

## 2016-02-14 DIAGNOSIS — N952 Postmenopausal atrophic vaginitis: Secondary | ICD-10-CM | POA: Diagnosis not present

## 2016-02-14 DIAGNOSIS — E538 Deficiency of other specified B group vitamins: Secondary | ICD-10-CM

## 2016-02-14 DIAGNOSIS — Z0000 Encounter for general adult medical examination without abnormal findings: Secondary | ICD-10-CM

## 2016-02-14 DIAGNOSIS — R002 Palpitations: Secondary | ICD-10-CM

## 2016-02-14 DIAGNOSIS — F411 Generalized anxiety disorder: Secondary | ICD-10-CM | POA: Diagnosis not present

## 2016-02-14 DIAGNOSIS — J301 Allergic rhinitis due to pollen: Secondary | ICD-10-CM | POA: Diagnosis not present

## 2016-02-14 DIAGNOSIS — B001 Herpesviral vesicular dermatitis: Secondary | ICD-10-CM | POA: Insufficient documentation

## 2016-02-14 LAB — URINALYSIS
BILIRUBIN URINE: NEGATIVE
Ketones, ur: NEGATIVE
Leukocytes, UA: NEGATIVE
Nitrite: NEGATIVE
PH: 6.5 (ref 5.0–8.0)
Specific Gravity, Urine: <=1.005 — AB (ref 1.000–1.030)
TOTAL PROTEIN, URINE-UPE24: NEGATIVE
URINE GLUCOSE: NEGATIVE
Urobilinogen, UA: 0.2 (ref 0.0–1.0)

## 2016-02-14 LAB — CBC WITH DIFFERENTIAL/PLATELET
Basophils Absolute: 0 10*3/uL (ref 0.0–0.1)
Basophils Relative: 0.7 % (ref 0.0–3.0)
EOS ABS: 0.1 10*3/uL (ref 0.0–0.7)
Eosinophils Relative: 1.7 % (ref 0.0–5.0)
HEMATOCRIT: 45.5 % (ref 36.0–46.0)
Hemoglobin: 15.7 g/dL — ABNORMAL HIGH (ref 12.0–15.0)
LYMPHS PCT: 40.9 % (ref 12.0–46.0)
Lymphs Abs: 2.2 10*3/uL (ref 0.7–4.0)
MCHC: 34.4 g/dL (ref 30.0–36.0)
MCV: 92.4 fl (ref 78.0–100.0)
MONO ABS: 0.4 10*3/uL (ref 0.1–1.0)
Monocytes Relative: 7.9 % (ref 3.0–12.0)
Neutro Abs: 2.7 10*3/uL (ref 1.4–7.7)
Neutrophils Relative %: 48.8 % (ref 43.0–77.0)
Platelets: 280 10*3/uL (ref 150.0–400.0)
RBC: 4.92 Mil/uL (ref 3.87–5.11)
RDW: 13.5 % (ref 11.5–15.5)
WBC: 5.5 10*3/uL (ref 4.0–10.5)

## 2016-02-14 LAB — BASIC METABOLIC PANEL
BUN: 15 mg/dL (ref 6–23)
CO2: 31 mEq/L (ref 19–32)
CREATININE: 0.76 mg/dL (ref 0.40–1.20)
Calcium: 9.7 mg/dL (ref 8.4–10.5)
Chloride: 103 mEq/L (ref 96–112)
GFR: 83.3 mL/min (ref >60.00)
Glucose, Bld: 97 mg/dL (ref 70–99)
POTASSIUM: 3.8 meq/L (ref 3.5–5.1)
Sodium: 140 mEq/L (ref 135–145)

## 2016-02-14 LAB — HEPATIC FUNCTION PANEL
ALK PHOS: 57 U/L (ref 39–117)
ALT: 20 U/L (ref 0–35)
AST: 22 U/L (ref 0–37)
Albumin: 4.7 g/dL (ref 3.5–5.2)
BILIRUBIN DIRECT: 0.2 mg/dL (ref 0.0–0.3)
BILIRUBIN TOTAL: 1.5 mg/dL — AB (ref 0.2–1.2)
Total Protein: 7.9 g/dL (ref 6.0–8.3)

## 2016-02-14 LAB — LIPID PANEL
CHOL/HDL RATIO: 2
Cholesterol: 204 mg/dL — ABNORMAL HIGH (ref 0–200)
HDL: 82.2 mg/dL (ref >39.00)
LDL CALC: 98 mg/dL (ref 0–99)
NONHDL: 121.84
Triglycerides: 121 mg/dL (ref 0.0–149.0)
VLDL: 24.2 mg/dL (ref 0.0–40.0)

## 2016-02-14 LAB — TSH: TSH: 2.23 u[IU]/mL (ref 0.35–4.50)

## 2016-02-14 LAB — VITAMIN B12: VITAMIN B 12: 475 pg/mL (ref 211–911)

## 2016-02-14 MED ORDER — ESCITALOPRAM OXALATE 5 MG PO TABS: 5.0000 mg | ORAL_TABLET | Freq: Every day | ORAL | 1 refills | Status: DC

## 2016-02-14 MED ORDER — PROMETHAZINE HCL 25 MG PO TABS: 25.0000 mg | ORAL_TABLET | Freq: Four times a day (QID) | ORAL | 2 refills | Status: DC | PRN

## 2016-02-14 MED ORDER — NITROFURANTOIN MONOHYD MACRO 100 MG PO CAPS: 100.0000 mg | ORAL_CAPSULE | Freq: Two times a day (BID) | ORAL | 2 refills | Status: DC

## 2016-02-14 MED ORDER — ZOLPIDEM TARTRATE 5 MG PO TABS: ORAL_TABLET | ORAL | 3 refills | Status: DC

## 2016-02-14 MED ORDER — AZITHROMYCIN 250 MG PO TABS: ORAL_TABLET | ORAL | 0 refills | Status: DC

## 2016-02-14 NOTE — Patient Instructions (Signed)
Follow up in one year for annual exam  You may obtain a copy of any labs that were done today by logging onto MyChart as outlined in the instructions provided with your AVS (after visit summary). The office will not call with normal lab results but certainly if there are any significant abnormalities then we will contact you.   Health Maintenance Adopting a healthy lifestyle and getting preventive care can go a long way to promote health and wellness. Talk with your health care provider about what schedule of regular examinations is right for you. This is a good chance for you to check in with your provider about disease prevention and staying healthy. In between checkups, there are plenty of things you can do on your own. Experts have done a lot of research about which lifestyle changes and preventive measures are most likely to keep you healthy. Ask your health care provider for more information. WEIGHT AND DIET  Eat a healthy diet  Be sure to include plenty of vegetables, fruits, low-fat dairy products, and lean protein.  Do not eat a lot of foods high in solid fats, added sugars, or salt.  Get regular exercise. This is one of the most important things you can do for your health.  Most adults should exercise for at least 150 minutes each week. The exercise should increase your heart rate and make you sweat (moderate-intensity exercise).  Most adults should also do strengthening exercises at least twice a week. This is in addition to the moderate-intensity exercise.  Maintain a healthy weight  Body mass index (BMI) is a measurement that can be used to identify possible weight problems. It estimates body fat based on height and weight. Your health care provider can help determine your BMI and help you achieve or maintain a healthy weight.  For females 74 years of age and older:   A BMI below 18.5 is considered underweight.  A BMI of 18.5 to 24.9 is normal.  A BMI of 25 to 29.9 is  considered overweight.  A BMI of 30 and above is considered obese.  Watch levels of cholesterol and blood lipids  You should start having your blood tested for lipids and cholesterol at 58 years of age, then have this test every 5 years.  You may need to have your cholesterol levels checked more often if:  Your lipid or cholesterol levels are high.  You are older than 58 years of age.  You are at high risk for heart disease.  CANCER SCREENING   Lung Cancer  Lung cancer screening is recommended for adults 3-39 years old who are at high risk for lung cancer because of a history of smoking.  A yearly low-dose CT scan of the lungs is recommended for people who:  Currently smoke.  Have quit within the past 15 years.  Have at least a 30-pack-year history of smoking. A pack year is smoking an average of one pack of cigarettes a day for 1 year.  Yearly screening should continue until it has been 15 years since you quit.  Yearly screening should stop if you develop a health problem that would prevent you from having lung cancer treatment.  Breast Cancer  Practice breast self-awareness. This means understanding how your breasts normally appear and feel.  It also means doing regular breast self-exams. Let your health care provider know about any changes, no matter how small.  If you are in your 20s or 30s, you should have a clinical breast  exam (CBE) by a health care provider every 1-3 years as part of a regular health exam.  If you are 36 or older, have a CBE every year. Also consider having a breast X-ray (mammogram) every year.  If you have a family history of breast cancer, talk to your health care provider about genetic screening.  If you are at high risk for breast cancer, talk to your health care provider about having an MRI and a mammogram every year.  Breast cancer gene (BRCA) assessment is recommended for women who have family members with BRCA-related cancers.  BRCA-related cancers include:  Breast.  Ovarian.  Tubal.  Peritoneal cancers.  Results of the assessment will determine the need for genetic counseling and BRCA1 and BRCA2 testing. Cervical Cancer Routine pelvic examinations to screen for cervical cancer are no longer recommended for nonpregnant women who are considered low risk for cancer of the pelvic organs (ovaries, uterus, and vagina) and who do not have symptoms. A pelvic examination may be necessary if you have symptoms including those associated with pelvic infections. Ask your health care provider if a screening pelvic exam is right for you.   The Pap test is the screening test for cervical cancer for women who are considered at risk.  If you had a hysterectomy for a problem that was not cancer or a condition that could lead to cancer, then you no longer need Pap tests.  If you are older than 65 years, and you have had normal Pap tests for the past 10 years, you no longer need to have Pap tests.  If you have had past treatment for cervical cancer or a condition that could lead to cancer, you need Pap tests and screening for cancer for at least 20 years after your treatment.  If you no longer get a Pap test, assess your risk factors if they change (such as having a new sexual partner). This can affect whether you should start being screened again.  Some women have medical problems that increase their chance of getting cervical cancer. If this is the case for you, your health care provider may recommend more frequent screening and Pap tests.  The human papillomavirus (HPV) test is another test that may be used for cervical cancer screening. The HPV test looks for the virus that can cause cell changes in the cervix. The cells collected during the Pap test can be tested for HPV.  The HPV test can be used to screen women 8 years of age and older. Getting tested for HPV can extend the interval between normal Pap tests from three to  five years.  An HPV test also should be used to screen women of any age who have unclear Pap test results.  After 58 years of age, women should have HPV testing as often as Pap tests.  Colorectal Cancer  This type of cancer can be detected and often prevented.  Routine colorectal cancer screening usually begins at 58 years of age and continues through 58 years of age.  Your health care provider may recommend screening at an earlier age if you have risk factors for colon cancer.  Your health care provider may also recommend using home test kits to check for hidden blood in the stool.  A small camera at the end of a tube can be used to examine your colon directly (sigmoidoscopy or colonoscopy). This is done to check for the earliest forms of colorectal cancer.  Routine screening usually begins at age 26.  Direct examination of the colon should be repeated every 5-10 years through 58 years of age. However, you may need to be screened more often if early forms of precancerous polyps or small growths are found. Skin Cancer  Check your skin from head to toe regularly.  Tell your health care provider about any new moles or changes in moles, especially if there is a change in a mole's shape or color.  Also tell your health care provider if you have a mole that is larger than the size of a pencil eraser.  Always use sunscreen. Apply sunscreen liberally and repeatedly throughout the day.  Protect yourself by wearing long sleeves, pants, a wide-brimmed hat, and sunglasses whenever you are outside. HEART DISEASE, DIABETES, AND HIGH BLOOD PRESSURE   Have your blood pressure checked at least every 1-2 years. High blood pressure causes heart disease and increases the risk of stroke.  If you are between 76 years and 55 years old, ask your health care provider if you should take aspirin to prevent strokes.  Have regular diabetes screenings. This involves taking a blood sample to check your  fasting blood sugar level.  If you are at a normal weight and have a low risk for diabetes, have this test once every three years after 58 years of age.  If you are overweight and have a high risk for diabetes, consider being tested at a younger age or more often. PREVENTING INFECTION  Hepatitis B  If you have a higher risk for hepatitis B, you should be screened for this virus. You are considered at high risk for hepatitis B if:  You were born in a country where hepatitis B is common. Ask your health care provider which countries are considered high risk.  Your parents were born in a high-risk country, and you have not been immunized against hepatitis B (hepatitis B vaccine).  You have HIV or AIDS.  You use needles to inject street drugs.  You live with someone who has hepatitis B.  You have had sex with someone who has hepatitis B.  You get hemodialysis treatment.  You take certain medicines for conditions, including cancer, organ transplantation, and autoimmune conditions. Hepatitis C  Blood testing is recommended for:  Everyone born from [birth year] through 1967.  Anyone with known risk factors for hepatitis C. Sexually transmitted infections (STIs)  You should be screened for sexually transmitted infections (STIs) including gonorrhea and chlamydia if:  You are sexually active and are younger than 58 years of age.  You are older than 58 years of age and your health care provider tells you that you are at risk for this type of infection.  Your sexual activity has changed since you were last screened and you are at an increased risk for chlamydia or gonorrhea. Ask your health care provider if you are at risk.  If you do not have HIV, but are at risk, it may be recommended that you take a prescription medicine daily to prevent HIV infection. This is called pre-exposure prophylaxis (PrEP). You are considered at risk if:  You are sexually active and do not regularly use condoms or  know the HIV status of your partner(s).  You take drugs by injection.  You are sexually active with a partner who has HIV. Talk with your health care provider about whether you are at high risk of being infected with HIV. If you choose to begin PrEP, you should first be tested for HIV. You should then be  tested every 3 months for as long as you are taking PrEP.  PREGNANCY   If you are premenopausal and you may become pregnant, ask your health care provider about preconception counseling.  If you may become pregnant, take 400 to 800 micrograms (mcg) of folic acid every day.  If you want to prevent pregnancy, talk to your health care provider about birth control (contraception). OSTEOPOROSIS AND MENOPAUSE   Osteoporosis is a disease in which the bones lose minerals and strength with aging. This can result in serious bone fractures. Your risk for osteoporosis can be identified using a bone density scan.  If you are 25 years of age or older, or if you are at risk for osteoporosis and fractures, ask your health care provider if you should be screened.  Ask your health care provider whether you should take a calcium or vitamin D supplement to lower your risk for osteoporosis.  Menopause may have certain physical symptoms and risks.  Hormone replacement therapy may reduce some of these symptoms and risks. Talk to your health care provider about whether hormone replacement therapy is right for you.  HOME CARE INSTRUCTIONS   Schedule regular health, dental, and eye exams.  Stay current with your immunizations.   Do not use any tobacco products including cigarettes, chewing tobacco, or electronic cigarettes.  If you are pregnant, do not drink alcohol.  If you are breastfeeding, limit how much and how often you drink alcohol.  Limit alcohol intake to no more than 1 drink per day for nonpregnant women. One drink equals 12 ounces of beer, 5 ounces of wine, or 1 ounces of hard liquor.  Do  not use street drugs.  Do not share needles.  Ask your health care provider for help if you need support or information about quitting drugs.  Tell your health care provider if you often feel depressed.  Tell your health care provider if you have ever been abused or do not feel safe at home. Document Released: 07/08/2010 Document Revised: 05/09/2013 Document Reviewed: 11/24/2012 [facility] Patient Information 2015 [place], [place]. This information is not intended to replace advice given to you by your health care provider. Make sure you discuss any questions you have with your health care provider.

## 2016-02-14 NOTE — Assessment & Plan Note (Signed)
On B12 

## 2016-02-14 NOTE — Addendum Note (Signed)
Addended by: Nelva Nay on: 02/14/2016 12:17 PM   Modules accepted: Orders

## 2016-02-14 NOTE — Assessment & Plan Note (Signed)
Valtrex prn  

## 2016-02-14 NOTE — Assessment & Plan Note (Signed)
On Vit D Labs 

## 2016-02-14 NOTE — Assessment & Plan Note (Signed)
Start Lexapro - low dose

## 2016-02-14 NOTE — Progress Notes (Signed)
Pre visit review using our clinic review tool, if applicable. No additional management support is needed unless otherwise documented below in the visit note. 

## 2016-02-14 NOTE — Assessment & Plan Note (Signed)
claritin prn  

## 2016-02-14 NOTE — Progress Notes (Signed)
Subjective:  Patient ID: Amber Lowe, female    DOB: Jun 18, 1960  Age: 56 y.o. MRN: [id number]  CC: No chief complaint on file.   HPI [facility] Noah presents for a Vit B12 def, stress at home/work, constipation f/u  Outpatient Medications Prior to Visit  Medication Sig Dispense Refill  . b complex vitamins capsule Take 1 capsule by mouth daily. 100 capsule 3  . Cholecalciferol (VITAMIN D3) 2000 UNITS capsule Take 1 capsule (2,000 Units total) by mouth daily. 100 capsule 3  . clonazePAM (KLONOPIN) 0.25 MG disintegrating tablet Take 1 tablet (0.25 mg total) by mouth 2 (two) times daily as needed. 60 tablet 1  . ergocalciferol (VITAMIN D2) 50000 UNITS capsule Take 1 capsule (50,000 Units total) by mouth once a week. 6 capsule 0  . nitrofurantoin, macrocrystal-monohydrate, (MACROBID) 100 MG capsule Take 1 capsule (100 mg total) by mouth 2 (two) times daily. For 7 days as needed for UTI 14 capsule 2  . propranolol (INDERAL) 10 MG tablet TAKE 1 TABLET BY MOUTH TWICE A DAY AS NEEDED FOR PALPITATIONS 60 tablet 2  . valACYclovir (VALTREX) 1000 MG tablet Take 2 pills twice daily for one day at onset of cold sore symptoms 60 tablet 2  . zolpidem (AMBIEN) 5 MG tablet TAKE 1 TABLET AT BEDTIME AS NEEDED 90 tablet 3  . azithromycin (ZITHROMAX) 250 MG tablet As directed (Patient not taking: Reported on 02/14/2016) 6 tablet 0  . promethazine (PHENERGAN) 25 MG tablet Take 1 tablet (25 mg total) by mouth every 6 (six) hours as needed for nausea or vomiting. (Patient not taking: Reported on 02/14/2016) 60 tablet 2   Facility-Administered Medications Prior to Visit  Medication Dose Route Frequency Provider Last Rate Last Dose  . methylPREDNISolone acetate (DEPO-MEDROL) injection 40 mg  40 mg Intra-articular Once Jennings Corado V, MD        ROS Review of Systems  Constitutional: Negative for activity change, appetite change, chills, fatigue and unexpected weight change.  HENT: Negative for congestion, mouth  sores and sinus pressure.   Eyes: Negative for visual disturbance.  Respiratory: Negative for cough and chest tightness.   Gastrointestinal: Negative for abdominal pain and nausea.  Genitourinary: Negative for difficulty urinating, frequency and vaginal pain.  Musculoskeletal: Negative for back pain and gait problem.  Skin: Negative for pallor and rash.  Neurological: Negative for dizziness, tremors, weakness, numbness and headaches.  Psychiatric/Behavioral: Positive for sleep disturbance. Negative for confusion and suicidal ideas. The patient is nervous/anxious.     Objective:  BP 116/74   Pulse 68   Temp 98.2 F (36.8 C) (Oral)   Resp 20   Wt 119 lb (54 kg)   LMP 06/19/2012   SpO2 94%   BMI 21.77 kg/m   BP Readings from Last 3 Encounters:  02/14/16 116/74  05/17/15 118/80  08/18/14 124/86    Wt Readings from Last 3 Encounters:  02/14/16 119 lb (54 kg)  05/17/15 132 lb (59.9 kg)  08/18/14 129 lb (58.5 kg)    Physical Exam  Constitutional: She appears well-developed. No distress.  HENT:  Head: Normocephalic.  Right Ear: External ear normal.  Left Ear: External ear normal.  Nose: Nose normal.  Mouth/Throat: Oropharynx is clear and moist.  Eyes: Conjunctivae are normal. Pupils are equal, round, and reactive to light. Right eye exhibits no discharge. Left eye exhibits no discharge.  Neck: Normal range of motion. Neck supple. No JVD present. No tracheal deviation present. No thyromegaly present.  Cardiovascular: Normal  rate, regular rhythm and normal heart sounds.   Pulmonary/Chest: No stridor. No respiratory distress. She has no wheezes.  Abdominal: Soft. Bowel sounds are normal. She exhibits no distension and no mass. There is no tenderness. There is no rebound and no guarding.  Musculoskeletal: She exhibits no edema or tenderness.  Lymphadenopathy:    She has no cervical adenopathy.  Neurological: She displays normal reflexes. No cranial nerve deficit. She exhibits  normal muscle tone. Coordination normal.  Skin: No rash noted. No erythema.  Psychiatric: Her behavior is normal. Judgment and thought content normal.    Lab Results  Component Value Date   WBC 7.4 05/17/2015   HGB 13.2 05/17/2015   HCT 39.7 05/17/2015   PLT 252.0 05/17/2015   GLUCOSE 94 05/17/2015   CHOL 202 (H) 05/17/2015   TRIG 137.0 05/17/2015   HDL 50.00 05/17/2015   LDLDIRECT 155.4 11/23/2012   LDLCALC 124 (H) 05/17/2015   ALT 24 05/17/2015   AST 18 05/17/2015   NA 143 05/17/2015   K 4.2 05/17/2015   CL 105 05/17/2015   CREATININE 0.73 05/17/2015   BUN 15 05/17/2015   CO2 26 05/17/2015   TSH 2.28 05/17/2015    Dg Chest 2 View  Result Date: 08/18/2014 CLINICAL DATA:  56 year old female with history of cough and congestion for 1 week. EXAM: CHEST - 2 VIEW COMPARISON:  05/21/2011, 05/15/2009 FINDINGS: Cardiomediastinal silhouette projects within normal limits in size and contour. No confluent airspace disease, pneumothorax, or pleural effusion. No displaced fracture. Unremarkable appearance of the upper abdomen. IMPRESSION: No radiographic evidence of acute cardiopulmonary disease. Signed, Dulcy Fanny. Earleen Newport, DO [facility] Electronically Signed   By: Corrie Mckusick D.O.   On: 08/18/2014 09:03    Assessment & Plan:   There are no diagnoses linked to this encounter. I am having Ms. Solecki maintain her clonazePAM, ergocalciferol, b complex vitamins, Vitamin D3, zolpidem, promethazine, nitrofurantoin (macrocrystal-monohydrate), azithromycin, valACYclovir, and propranolol. We will continue to administer methylPREDNISolone acetate.  No orders of the defined types were placed in this encounter.    Follow-up: No Follow-up on file.  Walker Kehr, MD

## 2016-02-14 NOTE — Progress Notes (Signed)
    Amber Lowe January 17, 1960 [id number]        56 y.o.  G2P0011 for annual exam.    Past medical history,surgical history, problem list, medications, allergies, family history and social history were all reviewed and documented as reviewed in the EPIC chart.  ROS:  Performed with pertinent positives and negatives included in the history, assessment and plan.   Additional significant findings :  None   Exam: [name] assistant Vitals:   02/14/16 1145  BP: 114/74  Weight: 119 lb (54 kg)  Height: 5\' 3"  (1.6 m)   Body mass index is 21.08 kg/m.  General appearance:  Normal affect, orientation and appearance. Skin: Grossly normal HEENT: Without gross lesions.  No cervical or supraclavicular adenopathy. Thyroid normal.  Lungs:  Clear without wheezing, rales or rhonchi Cardiac: RR, without RMG Abdominal:  Soft, nontender, without masses, guarding, rebound, organomegaly or hernia Breasts:  Examined lying and sitting without masses, retractions, discharge or axillary adenopathy. Pelvic:  Ext, BUS, Vagina with atrophic changes  Cervix with atrophic changes  Uterus anteverted, normal size, shape and contour, midline and mobile nontender   Adnexa without masses or tenderness    Anus and perineum normal   Rectovaginal normal sphincter tone without palpated masses or tenderness.    Assessment/Plan:  56 y.o. G51P0011 female for annual exam.   1. Postmenopausal/atrophic genital changes.  Had been having hot flushes but notes that these have significantly resolved. No vaginal dryness or any bleeding. We'll continue to monitor and report any issues or bleeding. 2. 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 Exam shows uterus to be normal size. Continue with annual exam surveillance 3. Mammography scheduled tomorrow. SBE monthly reviewed. 4. Pap smear 2016. Pap smear done today. No history of significant abnormal Pap smears.  5. Colonoscopy 2011. Repeat at their  recommended interval. 6. DEXA never. Will plan further into the menopause. Increased calcium vitamin D reviewed. 7. Health maintenance. History of hepatitis B. Actively followed by Dr. Alain Marion you just saw her today. He is now doing all of her routine blood work and refilling her prescriptions. Patient will follow up with me in one year, sooner as needed.  Anastasio Auerbach MD, 12:04 PM 02/14/2016

## 2016-02-14 NOTE — Assessment & Plan Note (Signed)
Propranolol prn 

## 2016-02-14 NOTE — Assessment & Plan Note (Addendum)
Discussed stress management We can try Lexapro - low dose

## 2016-02-15 ENCOUNTER — Ambulatory Visit
Admission: RE | Admit: 2016-02-15 | Discharge: 2016-02-15 | Disposition: A | Discharge status: Home / Self Care | Payer: 59 | Source: Ambulatory Visit | Attending: Internal Medicine | Admitting: Internal Medicine

## 2016-02-15 DIAGNOSIS — Z1231 Encounter for screening mammogram for malignant neoplasm of breast: Secondary | ICD-10-CM

## 2016-02-15 LAB — PAP IG W/ RFLX HPV ASCU

## 2016-03-28 ENCOUNTER — Telehealth: Payer: Self-pay

## 2016-03-28 NOTE — Telephone Encounter (Signed)
Mailed Physician Results Form to patient home

## 2016-08-21 ENCOUNTER — Other Ambulatory Visit: Payer: Self-pay | Admitting: Internal Medicine

## 2016-10-30 ENCOUNTER — Encounter: Payer: Self-pay | Admitting: Internal Medicine

## 2016-10-30 ENCOUNTER — Other Ambulatory Visit (INDEPENDENT_AMBULATORY_CARE_PROVIDER_SITE_OTHER): Payer: 59

## 2016-10-30 ENCOUNTER — Ambulatory Visit (INDEPENDENT_AMBULATORY_CARE_PROVIDER_SITE_OTHER): Payer: Self-pay | Admitting: Internal Medicine

## 2016-10-30 VITALS — BP 112/80 | HR 78 | Temp 98.4°F | Ht 63.0 in | Wt 122.0 lb

## 2016-10-30 DIAGNOSIS — Z0000 Encounter for general adult medical examination without abnormal findings: Secondary | ICD-10-CM | POA: Diagnosis not present

## 2016-10-30 DIAGNOSIS — B373 Candidiasis of vulva and vagina: Secondary | ICD-10-CM | POA: Diagnosis not present

## 2016-10-30 DIAGNOSIS — F4321 Adjustment disorder with depressed mood: Secondary | ICD-10-CM | POA: Diagnosis not present

## 2016-10-30 DIAGNOSIS — F411 Generalized anxiety disorder: Secondary | ICD-10-CM | POA: Diagnosis not present

## 2016-10-30 DIAGNOSIS — E538 Deficiency of other specified B group vitamins: Secondary | ICD-10-CM | POA: Diagnosis not present

## 2016-10-30 DIAGNOSIS — F4320 Adjustment disorder, unspecified: Secondary | ICD-10-CM | POA: Insufficient documentation

## 2016-10-30 DIAGNOSIS — B3731 Acute candidiasis of vulva and vagina: Secondary | ICD-10-CM

## 2016-10-30 DIAGNOSIS — F4329 Adjustment disorder with other symptoms: Secondary | ICD-10-CM

## 2016-10-30 LAB — CBC WITH DIFFERENTIAL/PLATELET
BASOS ABS: 0 10*3/uL (ref 0.0–0.1)
Basophils Relative: 0.8 % (ref 0.0–3.0)
EOS ABS: 0.1 10*3/uL (ref 0.0–0.7)
Eosinophils Relative: 2 % (ref 0.0–5.0)
HCT: 45.7 % (ref 36.0–46.0)
Hemoglobin: 15.4 g/dL — ABNORMAL HIGH (ref 12.0–15.0)
LYMPHS ABS: 2.2 10*3/uL (ref 0.7–4.0)
Lymphocytes Relative: 45.4 % (ref 12.0–46.0)
MCHC: 33.7 g/dL (ref 30.0–36.0)
MCV: 95.1 fl (ref 78.0–100.0)
MONOS PCT: 9 % (ref 3.0–12.0)
Monocytes Absolute: 0.4 10*3/uL (ref 0.1–1.0)
NEUTROS PCT: 42.8 % — AB (ref 43.0–77.0)
Neutro Abs: 2 10*3/uL (ref 1.4–7.7)
PLATELETS: 289 10*3/uL (ref 150.0–400.0)
RBC: 4.81 Mil/uL (ref 3.87–5.11)
RDW: 13.4 % (ref 11.5–15.5)
WBC: 4.8 10*3/uL (ref 4.0–10.5)

## 2016-10-30 LAB — LIPID PANEL
CHOLESTEROL: 192 mg/dL (ref 0–200)
HDL: 74.9 mg/dL (ref >39.00)
LDL Cholesterol: 96 mg/dL (ref 0–99)
NONHDL: 117.49
TRIGLYCERIDES: 107 mg/dL (ref 0.0–149.0)
Total CHOL/HDL Ratio: 3
VLDL: 21.4 mg/dL (ref 0.0–40.0)

## 2016-10-30 LAB — HEPATIC FUNCTION PANEL
ALBUMIN: 4.8 g/dL (ref 3.5–5.2)
ALT: 19 U/L (ref 0–35)
AST: 16 U/L (ref 0–37)
Alkaline Phosphatase: 58 U/L (ref 39–117)
Bilirubin, Direct: 0.3 mg/dL (ref 0.0–0.3)
TOTAL PROTEIN: 7.9 g/dL (ref 6.0–8.3)
Total Bilirubin: 1.9 mg/dL — ABNORMAL HIGH (ref 0.2–1.2)

## 2016-10-30 LAB — URINALYSIS, ROUTINE W REFLEX MICROSCOPIC
BILIRUBIN URINE: NEGATIVE
KETONES UR: NEGATIVE
LEUKOCYTES UA: NEGATIVE
Nitrite: NEGATIVE
Specific Gravity, Urine: 1.015 (ref 1.000–1.030)
Total Protein, Urine: NEGATIVE
URINE GLUCOSE: NEGATIVE
Urobilinogen, UA: 0.2 (ref 0.0–1.0)
pH: 7 (ref 5.0–8.0)

## 2016-10-30 LAB — BASIC METABOLIC PANEL
BUN: 12 mg/dL (ref 6–23)
CALCIUM: 10 mg/dL (ref 8.4–10.5)
CHLORIDE: 101 meq/L (ref 96–112)
CO2: 29 meq/L (ref 19–32)
CREATININE: 0.86 mg/dL (ref 0.40–1.20)
GFR: 72.04 mL/min (ref >60.00)
GLUCOSE: 104 mg/dL — AB (ref 70–99)
Potassium: 3.9 mEq/L (ref 3.5–5.1)
Sodium: 140 mEq/L (ref 135–145)

## 2016-10-30 LAB — TSH: TSH: 1.54 u[IU]/mL (ref 0.35–4.50)

## 2016-10-30 LAB — VITAMIN D 25 HYDROXY (VIT D DEFICIENCY, FRACTURES): VITD: 26.6 ng/mL — AB (ref 30.00–100.00)

## 2016-10-30 MED ORDER — NITROFURANTOIN MONOHYD MACRO 100 MG PO CAPS: 100.0000 mg | ORAL_CAPSULE | Freq: Two times a day (BID) | ORAL | 2 refills | Status: DC

## 2016-10-30 MED ORDER — VALACYCLOVIR HCL 1 G PO TABS: ORAL_TABLET | ORAL | 2 refills | Status: DC

## 2016-10-30 MED ORDER — KETOCONAZOLE 200 MG PO TABS: 200.0000 mg | ORAL_TABLET | Freq: Every day | ORAL | 1 refills | Status: DC

## 2016-10-30 MED ORDER — ZOLPIDEM TARTRATE 5 MG PO TABS: ORAL_TABLET | ORAL | 1 refills | Status: DC

## 2016-10-30 MED ORDER — PROMETHAZINE HCL 25 MG PO TABS: 25.0000 mg | ORAL_TABLET | Freq: Four times a day (QID) | ORAL | 2 refills | Status: DC | PRN

## 2016-10-30 MED ORDER — KETOCONAZOLE 2 % EX CREA: 1.0000 "application " | TOPICAL_CREAM | Freq: Every day | CUTANEOUS | 1 refills | Status: DC

## 2016-10-30 MED ORDER — AZITHROMYCIN 250 MG PO TABS
ORAL_TABLET | ORAL | 0 refills | Status: DC
Start: 2016-10-30 — End: 2017-02-16

## 2016-10-30 NOTE — Patient Instructions (Signed)
Senakot for constipation

## 2016-10-30 NOTE — Progress Notes (Signed)
Subjective:  Patient ID: Amber Lowe, female    DOB: [birth year]  Age: 56 y.o. MRN: [id number]  CC: No chief complaint on file.   HPI [facility] presents for a well exam C/o vaginal itching, anxiety, URI sx's  Outpatient Medications Prior to Visit  Medication Sig Dispense Refill  . b complex vitamins capsule Take 1 capsule by mouth daily. 100 capsule 3  . Cholecalciferol (VITAMIN D3) 2000 UNITS capsule Take 1 capsule (2,000 Units total) by mouth daily. 100 capsule 3  . clonazePAM (KLONOPIN) 0.25 MG disintegrating tablet Take 1 tablet (0.25 mg total) by mouth 2 (two) times daily as needed. 60 tablet 1  . ergocalciferol (VITAMIN D2) 50000 UNITS capsule Take 1 capsule (50,000 Units total) by mouth once a week. 6 capsule 0  . escitalopram (LEXAPRO) 5 MG tablet TAKE 1 TABLET (5 MG TOTAL) BY MOUTH DAILY. 90 tablet 1  . nitrofurantoin, macrocrystal-monohydrate, (MACROBID) 100 MG capsule Take 1 capsule (100 mg total) by mouth 2 (two) times daily. For 7 days as needed for UTI 14 capsule 2  . promethazine (PHENERGAN) 25 MG tablet Take 1 tablet (25 mg total) by mouth every 6 (six) hours as needed for nausea or vomiting. 60 tablet 2  . propranolol (INDERAL) 10 MG tablet TAKE 1 TABLET BY MOUTH TWICE A DAY AS NEEDED FOR PALPITATIONS 60 tablet 2  . valACYclovir (VALTREX) 1000 MG tablet Take 2 pills twice daily for one day at onset of cold sore symptoms 60 tablet 2  . zolpidem (AMBIEN) 5 MG tablet TAKE 1 TABLET AT BEDTIME AS NEEDED 90 tablet 3  . azithromycin (ZITHROMAX) 250 MG tablet As directed (Patient not taking: Reported on 02/14/2016) 6 tablet 0   Facility-Administered Medications Prior to Visit  Medication Dose Route Frequency Provider Last Rate Last Dose  . methylPREDNISolone acetate (DEPO-MEDROL) injection 40 mg  40 mg Intra-articular Once Adelise Buswell, Evie Lacks, MD        ROS Review of Systems  Constitutional: Negative for activity change, appetite change, chills, fatigue and unexpected  weight change.  HENT: Negative for congestion, mouth sores and sinus pressure.   Eyes: Negative for visual disturbance.  Respiratory: Negative for cough and chest tightness.   Gastrointestinal: Negative for abdominal pain and nausea.  Genitourinary: Negative for difficulty urinating, frequency and vaginal pain.  Musculoskeletal: Negative for back pain and gait problem.  Skin: Positive for rash. Negative for pallor.  Neurological: Negative for dizziness, tremors, weakness, numbness and headaches.  Psychiatric/Behavioral: Positive for dysphoric mood. Negative for confusion and sleep disturbance. The patient is nervous/anxious.     Objective:  BP 112/80 (BP Location: Left Arm, Patient Position: Sitting, Cuff Size: Normal)   Pulse 78   Temp 98.4 F (36.9 C) (Oral)   Ht 5\' 3"  (1.6 m)   Wt 122 lb (55.3 kg)   LMP 06/19/2012   SpO2 98%   BMI 21.61 kg/m   BP Readings from Last 3 Encounters:  10/30/16 112/80  02/14/16 114/74  02/14/16 116/74    Wt Readings from Last 3 Encounters:  10/30/16 122 lb (55.3 kg)  02/14/16 119 lb (54 kg)  02/14/16 119 lb (54 kg)    Physical Exam  Constitutional: She appears well-developed. No distress.  HENT:  Head: Normocephalic.  Right Ear: External ear normal.  Left Ear: External ear normal.  Nose: Nose normal.  Mouth/Throat: Oropharynx is clear and moist.  Eyes: Pupils are equal, round, and reactive to light. Conjunctivae are normal. Right eye exhibits no  discharge. Left eye exhibits no discharge.  Neck: Normal range of motion. Neck supple. No JVD present. No tracheal deviation present. No thyromegaly present.  Cardiovascular: Normal rate, regular rhythm and normal heart sounds.   Pulmonary/Chest: No stridor. No respiratory distress. She has no wheezes.  Abdominal: Soft. Bowel sounds are normal. She exhibits no distension and no mass. There is no tenderness. There is no rebound and no guarding.  Musculoskeletal: She exhibits no edema or  tenderness.  Lymphadenopathy:    She has no cervical adenopathy.  Neurological: She displays normal reflexes. No cranial nerve deficit. She exhibits normal muscle tone. Coordination normal.  Skin: Rash noted. There is erythema.  Psychiatric: Her behavior is normal. Judgment and thought content normal.  eryth papules confluent on labia B sad Lab Results  Component Value Date   WBC 5.5 02/14/2016   HGB 15.7 (H) 02/14/2016   HCT 45.5 02/14/2016   PLT 280.0 02/14/2016   GLUCOSE 97 02/14/2016   CHOL 204 (H) 02/14/2016   TRIG 121.0 02/14/2016   HDL 82.20 02/14/2016   LDLDIRECT 155.4 11/23/2012   LDLCALC 98 02/14/2016   ALT 20 02/14/2016   AST 22 02/14/2016   NA 140 02/14/2016   K 3.8 02/14/2016   CL 103 02/14/2016   CREATININE 0.76 02/14/2016   BUN 15 02/14/2016   CO2 31 02/14/2016   TSH 2.23 02/14/2016    Mm Digital Screening Bilateral  Result Date: 02/18/2016 CLINICAL DATA:  Screening. EXAM: DIGITAL SCREENING BILATERAL MAMMOGRAM WITH CAD COMPARISON:  Previous exam(s). ACR Breast Density Category b: There are scattered areas of fibroglandular density. FINDINGS: There are no findings suspicious for malignancy. Images were processed with CAD. IMPRESSION: No mammographic evidence of malignancy. A result letter of this screening mammogram will be mailed directly to the patient. RECOMMENDATION: Screening mammogram in one year. (Code:SM-B-01Y) BI-RADS CATEGORY  1: Negative. Electronically Signed   By: Ammie Ferrier M.D.   On: 02/18/2016 15:27    Assessment & Plan:   There are no diagnoses linked to this encounter. I have discontinued [name]'s azithromycin. I am also having her maintain her clonazePAM, ergocalciferol, b complex vitamins, Vitamin D3, valACYclovir, propranolol, zolpidem, nitrofurantoin (macrocrystal-monohydrate), promethazine, and escitalopram. We will continue to administer methylPREDNISolone acetate.  No orders of the defined types were placed in this  encounter.    Follow-up: No Follow-up on file.  Walker Kehr, MD

## 2016-10-30 NOTE — Assessment & Plan Note (Signed)
Ketoconazole po and topical

## 2016-10-30 NOTE — Assessment & Plan Note (Addendum)
Mom died 11/07/2022 Discussed

## 2016-12-16 NOTE — Assessment & Plan Note (Signed)
On B12 

## 2016-12-16 NOTE — Assessment & Plan Note (Signed)
Discussed.

## 2016-12-16 NOTE — Assessment & Plan Note (Signed)
We discussed age appropriate health related issues, including available/recomended screening tests and vaccinations. We discussed a need for adhering to healthy diet and exercise. Labs were ordered to be later reviewed . All questions were answered.   

## 2017-01-19 ENCOUNTER — Other Ambulatory Visit: Payer: Self-pay | Admitting: Internal Medicine

## 2017-01-19 DIAGNOSIS — Z1231 Encounter for screening mammogram for malignant neoplasm of breast: Secondary | ICD-10-CM

## 2017-02-16 ENCOUNTER — Encounter: Payer: Self-pay | Admitting: Internal Medicine

## 2017-02-16 ENCOUNTER — Ambulatory Visit (INDEPENDENT_AMBULATORY_CARE_PROVIDER_SITE_OTHER): Payer: 59 | Admitting: Internal Medicine

## 2017-02-16 DIAGNOSIS — E538 Deficiency of other specified B group vitamins: Secondary | ICD-10-CM | POA: Diagnosis not present

## 2017-02-16 DIAGNOSIS — G4700 Insomnia, unspecified: Secondary | ICD-10-CM

## 2017-02-16 DIAGNOSIS — L309 Dermatitis, unspecified: Secondary | ICD-10-CM | POA: Diagnosis not present

## 2017-02-16 DIAGNOSIS — E559 Vitamin D deficiency, unspecified: Secondary | ICD-10-CM

## 2017-02-16 DIAGNOSIS — K5900 Constipation, unspecified: Secondary | ICD-10-CM

## 2017-02-16 DIAGNOSIS — F4321 Adjustment disorder with depressed mood: Secondary | ICD-10-CM | POA: Diagnosis not present

## 2017-02-16 MED ORDER — VITAMIN D3 1.25 MG (50000 UT) PO CAPS: 1.0000 | ORAL_CAPSULE | ORAL | 0 refills | Status: DC

## 2017-02-16 MED ORDER — LINACLOTIDE 290 MCG PO CAPS: 290.0000 ug | ORAL_CAPSULE | Freq: Every day | ORAL | 3 refills | Status: DC

## 2017-02-16 MED ORDER — TRIAMCINOLONE ACETONIDE 0.5 % EX CREA: 1.0000 "application " | TOPICAL_CREAM | Freq: Three times a day (TID) | CUTANEOUS | 3 refills | Status: AC

## 2017-02-16 MED ORDER — ZOLPIDEM TARTRATE 5 MG PO TABS: ORAL_TABLET | ORAL | 1 refills | Status: DC

## 2017-02-16 MED ORDER — OSELTAMIVIR PHOSPHATE 75 MG PO CAPS: 75.0000 mg | ORAL_CAPSULE | Freq: Two times a day (BID) | ORAL | 0 refills | Status: DC

## 2017-02-16 MED ORDER — PROMETHAZINE HCL 25 MG PO TABS: 25.0000 mg | ORAL_TABLET | Freq: Four times a day (QID) | ORAL | 2 refills | Status: DC | PRN

## 2017-02-16 NOTE — Progress Notes (Signed)
Subjective:  Patient ID: Amber Lowe, female    DOB: April 13, 1960  Age: 57 y.o. MRN: [id number]  CC: No chief complaint on file.   HPI [facility] Amber Lowe presents for insomnia, Vit D def, HAs f/u C/o constipation  Outpatient Medications Prior to Visit  Medication Sig Dispense Refill  . b complex vitamins capsule Take 1 capsule by mouth daily. 100 capsule 3  . Cholecalciferol (VITAMIN D3) 2000 UNITS capsule Take 1 capsule (2,000 Units total) by mouth daily. 100 capsule 3  . ergocalciferol (VITAMIN D2) 50000 UNITS capsule Take 1 capsule (50,000 Units total) by mouth once a week. 6 capsule 0  . promethazine (PHENERGAN) 25 MG tablet Take 1 tablet (25 mg total) by mouth every 6 (six) hours as needed for nausea or vomiting. 60 tablet 2  . valACYclovir (VALTREX) 1000 MG tablet Take 2 pills twice daily for one day at onset of cold sore symptoms 60 tablet 2  . zolpidem (AMBIEN) 5 MG tablet TAKE 1 TABLET AT BEDTIME AS NEEDED 90 tablet 1  . ketoconazole (NIZORAL) 2 % cream Apply 1 application topically daily. 45 g 1  . ketoconazole (NIZORAL) 200 MG tablet Take 1 tablet (200 mg total) by mouth daily. 10 tablet 1  . azithromycin (ZITHROMAX Z-PAK) 250 MG tablet As directed 6 each 0  . nitrofurantoin, macrocrystal-monohydrate, (MACROBID) 100 MG capsule Take 1 capsule (100 mg total) by mouth 2 (two) times daily. For 7 days as needed for UTI 14 capsule 2  . propranolol (INDERAL) 10 MG tablet TAKE 1 TABLET BY MOUTH TWICE A DAY AS NEEDED FOR PALPITATIONS (Patient not taking: Reported on 02/16/2017) 60 tablet 2   Facility-Administered Medications Prior to Visit  Medication Dose Route Frequency Provider Last Rate Last Dose  . methylPREDNISolone acetate (DEPO-MEDROL) injection 40 mg  40 mg Intra-articular Once Plotnikov, Evie Lacks, MD        ROS Review of Systems  Constitutional: Positive for fatigue. Negative for activity change, appetite change, chills and unexpected weight change.  HENT: Positive for  congestion. Negative for mouth sores and sinus pressure.   Eyes: Negative for visual disturbance.  Respiratory: Negative for cough and chest tightness.   Gastrointestinal: Positive for constipation and nausea. Negative for abdominal pain and blood in stool.  Genitourinary: Negative for difficulty urinating, frequency and vaginal pain.  Musculoskeletal: Negative for back pain and gait problem.  Skin: Negative for pallor and rash.  Neurological: Positive for headaches. Negative for dizziness, tremors, weakness and numbness.  Psychiatric/Behavioral: Positive for sleep disturbance. Negative for confusion, self-injury and suicidal ideas. The patient is nervous/anxious.     Objective:  BP 120/82 (BP Location: Left Arm, Patient Position: Sitting, Cuff Size: Normal)   Pulse 68   Temp 97.9 F (36.6 C) (Oral)   Ht 5\' 3"  (1.6 m)   Wt 122 lb (55.3 kg)   LMP 06/19/2012   SpO2 98%   BMI 21.61 kg/m   BP Readings from Last 3 Encounters:  02/16/17 120/82  10/30/16 112/80  02/14/16 114/74    Wt Readings from Last 3 Encounters:  02/16/17 122 lb (55.3 kg)  10/30/16 122 lb (55.3 kg)  02/14/16 119 lb (54 kg)    Physical Exam  Constitutional: She appears well-developed. No distress.  HENT:  Head: Normocephalic.  Right Ear: External ear normal.  Left Ear: External ear normal.  Nose: Nose normal.  Mouth/Throat: Oropharynx is clear and moist.  Eyes: Conjunctivae are normal. Pupils are equal, round, and reactive to light. Right  eye exhibits no discharge. Left eye exhibits no discharge.  Neck: Normal range of motion. Neck supple. No JVD present. No tracheal deviation present. No thyromegaly present.  Cardiovascular: Normal rate, regular rhythm and normal heart sounds.  Pulmonary/Chest: No stridor. No respiratory distress. She has no wheezes.  Abdominal: Soft. Bowel sounds are normal. She exhibits no distension and no mass. There is no tenderness. There is no rebound and no guarding.    Musculoskeletal: She exhibits no edema or tenderness.  Lymphadenopathy:    She has no cervical adenopathy.  Neurological: She displays normal reflexes. No cranial nerve deficit. She exhibits normal muscle tone. Coordination normal.  Skin: No rash noted. No erythema.  Psychiatric: She has a normal mood and affect. Her behavior is normal. Judgment and thought content normal.    Lab Results  Component Value Date   WBC 4.8 10/30/2016   HGB 15.4 (H) 10/30/2016   HCT 45.7 10/30/2016   PLT 289.0 10/30/2016   GLUCOSE 104 (H) 10/30/2016   CHOL 192 10/30/2016   TRIG 107.0 10/30/2016   HDL 74.90 10/30/2016   LDLDIRECT 155.4 11/23/2012   LDLCALC 96 10/30/2016   ALT 19 10/30/2016   AST 16 10/30/2016   NA 140 10/30/2016   K 3.9 10/30/2016   CL 101 10/30/2016   CREATININE 0.86 10/30/2016   BUN 12 10/30/2016   CO2 29 10/30/2016   TSH 1.54 10/30/2016    Mm Digital Screening Bilateral  Result Date: 02/18/2016 CLINICAL DATA:  Screening. EXAM: DIGITAL SCREENING BILATERAL MAMMOGRAM WITH CAD COMPARISON:  Previous exam(s). ACR Breast Density Category b: There are scattered areas of fibroglandular density. FINDINGS: There are no findings suspicious for malignancy. Images were processed with CAD. IMPRESSION: No mammographic evidence of malignancy. A result letter of this screening mammogram will be mailed directly to the patient. RECOMMENDATION: Screening mammogram in one year. (Code:SM-B-01Y) BI-RADS CATEGORY  1: Negative. Electronically Signed   By: Ammie Ferrier M.D.   On: 02/18/2016 15:27    Assessment & Plan:   There are no diagnoses linked to this encounter. I have discontinued Amber Lowe's propranolol, nitrofurantoin (macrocrystal-monohydrate), ketoconazole, ketoconazole, and azithromycin. I am also having her maintain her ergocalciferol, b complex vitamins, Vitamin D3, zolpidem, valACYclovir, and promethazine. We will continue to administer methylPREDNISolone acetate.  No orders of  the defined types were placed in this encounter.    Follow-up: No Follow-up on file.  Walker Kehr, MD

## 2017-02-16 NOTE — Assessment & Plan Note (Signed)
Triamc prn Rx

## 2017-02-16 NOTE — Assessment & Plan Note (Signed)
Zolpidem prn  Potential benefits of a long term benzodiazepines  use as well as potential risks  and complications were explained to the patient and were aknowledged. 

## 2017-02-16 NOTE — Assessment & Plan Note (Addendum)
Linzess prn or qd

## 2017-02-16 NOTE — Assessment & Plan Note (Signed)
Discussed.

## 2017-02-16 NOTE — Assessment & Plan Note (Signed)
On B12 

## 2017-02-16 NOTE — Assessment & Plan Note (Signed)
Vit D Rx °

## 2017-02-17 ENCOUNTER — Other Ambulatory Visit: Payer: Self-pay | Admitting: Internal Medicine

## 2017-02-17 ENCOUNTER — Other Ambulatory Visit (INDEPENDENT_AMBULATORY_CARE_PROVIDER_SITE_OTHER): Payer: 59

## 2017-02-17 DIAGNOSIS — E538 Deficiency of other specified B group vitamins: Secondary | ICD-10-CM

## 2017-02-17 DIAGNOSIS — E559 Vitamin D deficiency, unspecified: Secondary | ICD-10-CM | POA: Diagnosis not present

## 2017-02-17 LAB — CBC WITH DIFFERENTIAL/PLATELET
BASOS PCT: 1.1 % (ref 0.0–3.0)
Basophils Absolute: 0.1 10*3/uL (ref 0.0–0.1)
EOS ABS: 0.1 10*3/uL (ref 0.0–0.7)
Eosinophils Relative: 3 % (ref 0.0–5.0)
HCT: 45.3 % (ref 36.0–46.0)
Hemoglobin: 15.5 g/dL — ABNORMAL HIGH (ref 12.0–15.0)
Lymphocytes Relative: 44.5 % (ref 12.0–46.0)
Lymphs Abs: 2.1 10*3/uL (ref 0.7–4.0)
MCHC: 34.2 g/dL (ref 30.0–36.0)
MCV: 93.6 fl (ref 78.0–100.0)
MONO ABS: 0.3 10*3/uL (ref 0.1–1.0)
Monocytes Relative: 7.4 % (ref 3.0–12.0)
NEUTROS ABS: 2 10*3/uL (ref 1.4–7.7)
NEUTROS PCT: 44 % (ref 43.0–77.0)
PLATELETS: 251 10*3/uL (ref 150.0–400.0)
RBC: 4.84 Mil/uL (ref 3.87–5.11)
RDW: 13.4 % (ref 11.5–15.5)
WBC: 4.6 10*3/uL (ref 4.0–10.5)

## 2017-02-17 LAB — BASIC METABOLIC PANEL
BUN: 19 mg/dL (ref 6–23)
CHLORIDE: 101 meq/L (ref 96–112)
CO2: 30 meq/L (ref 19–32)
Calcium: 9.5 mg/dL (ref 8.4–10.5)
Creatinine, Ser: 0.73 mg/dL (ref 0.40–1.20)
GFR: 86.95 mL/min (ref >60.00)
Glucose, Bld: 104 mg/dL — ABNORMAL HIGH (ref 70–99)
Potassium: 4.3 mEq/L (ref 3.5–5.1)
Sodium: 139 mEq/L (ref 135–145)

## 2017-02-17 LAB — HEPATIC FUNCTION PANEL
ALBUMIN: 4.6 g/dL (ref 3.5–5.2)
ALK PHOS: 54 U/L (ref 39–117)
ALT: 20 U/L (ref 0–35)
AST: 17 U/L (ref 0–37)
BILIRUBIN DIRECT: 0.2 mg/dL (ref 0.0–0.3)
TOTAL PROTEIN: 7.7 g/dL (ref 6.0–8.3)
Total Bilirubin: 1.4 mg/dL — ABNORMAL HIGH (ref 0.2–1.2)

## 2017-02-17 LAB — LIPID PANEL
CHOL/HDL RATIO: 3
CHOLESTEROL: 191 mg/dL (ref 0–200)
HDL: 67.3 mg/dL (ref >39.00)
LDL CALC: 105 mg/dL — AB (ref 0–99)
NonHDL: 123.28
TRIGLYCERIDES: 91 mg/dL (ref 0.0–149.0)
VLDL: 18.2 mg/dL (ref 0.0–40.0)

## 2017-02-17 LAB — TSH: TSH: 2.82 u[IU]/mL (ref 0.35–4.50)

## 2017-02-17 LAB — URINALYSIS
BILIRUBIN URINE: NEGATIVE
HGB URINE DIPSTICK: NEGATIVE
KETONES UR: NEGATIVE
Leukocytes, UA: NEGATIVE
Nitrite: NEGATIVE
Specific Gravity, Urine: 1.01 (ref 1.000–1.030)
Total Protein, Urine: NEGATIVE
URINE GLUCOSE: NEGATIVE
UROBILINOGEN UA: 0.2 (ref 0.0–1.0)
pH: 8 (ref 5.0–8.0)

## 2017-02-17 MED ORDER — ASPIRIN EC 81 MG PO TBEC: 81.0000 mg | DELAYED_RELEASE_TABLET | Freq: Every day | ORAL | 3 refills | Status: AC

## 2017-02-18 ENCOUNTER — Encounter: Payer: Self-pay | Admitting: Gynecology

## 2017-02-18 ENCOUNTER — Ambulatory Visit
Admission: RE | Admit: 2017-02-18 | Discharge: 2017-02-18 | Disposition: A | Discharge status: Home / Self Care | Payer: 59 | Source: Ambulatory Visit | Attending: Internal Medicine | Admitting: Internal Medicine

## 2017-02-18 ENCOUNTER — Ambulatory Visit (INDEPENDENT_AMBULATORY_CARE_PROVIDER_SITE_OTHER): Payer: 59 | Admitting: Gynecology

## 2017-02-18 VITALS — BP 118/74 | Ht 63.0 in | Wt 122.0 lb

## 2017-02-18 DIAGNOSIS — Z1231 Encounter for screening mammogram for malignant neoplasm of breast: Secondary | ICD-10-CM | POA: Diagnosis not present

## 2017-02-18 DIAGNOSIS — Z01419 Encounter for gynecological examination (general) (routine) without abnormal findings: Secondary | ICD-10-CM | POA: Diagnosis not present

## 2017-02-18 DIAGNOSIS — N952 Postmenopausal atrophic vaginitis: Secondary | ICD-10-CM

## 2017-02-18 DIAGNOSIS — Z01411 Encounter for gynecological examination (general) (routine) with abnormal findings: Secondary | ICD-10-CM

## 2017-02-18 NOTE — Progress Notes (Signed)
    Amber Lowe Amber Lowe 11-Mar-1960 [id number]        57 y.o.  G2P0011 for annual gynecologic exam.  Doing well from a gynecologic standpoint.  Past medical history,surgical history, problem list, medications, allergies, family history and social history were all reviewed and documented as reviewed in the EPIC chart.  ROS:  Performed with pertinent positives and negatives included in the history, assessment and plan.   Additional significant findings : None   Exam: [name] assistant Vitals:   02/18/17 0801  BP: 118/74  Weight: 122 lb (55.3 kg)  Height: 5\' 3"  (1.6 m)   Body mass index is 21.61 kg/m.  General appearance:  Normal affect, orientation and appearance. Skin: Grossly normal HEENT: Without gross lesions.  No cervical or supraclavicular adenopathy. Thyroid normal.  Lungs:  Clear without wheezing, rales or rhonchi Cardiac: RR, without RMG Abdominal:  Soft, nontender, without masses, guarding, rebound, organomegaly or hernia Breasts:  Examined lying and sitting without masses, retractions, discharge or axillary adenopathy. Pelvic:  Ext, BUS, Vagina: Normal with atrophic changes  Cervix: Normal with atrophic changes  Uterus: Anteverted, normal size, shape and contour, midline and mobile nontender   Adnexa: Without masses or tenderness    Anus and perineum: Normal   Rectovaginal: Normal sphincter tone without palpated masses or tenderness.    Assessment/Plan:  57 y.o. G4P0011 female for annual gynecologic exam.   1. Postmenopausal/atrophic genital changes.  No significant hot flushes, night sweats, vaginal dryness or any vaginal bleeding.  Continue to monitor and report any issues or bleeding. 2. Mammography today.  Breast exam normal today. 3. Colonoscopy 2011.  Repeat at their recommended interval. 4. Pap smear 2018.  No Pap smear done today.  No history of abnormal Pap smears.  Plan repeat Pap smear at 3-year interval per current screening guidelines. 5. DEXA never.   Will plan further into the menopause.  Is on prescription strength vitamin D by Dr. Alain Marion and she is going to follow-up with him in reference to this monitoring. 6. Health maintenance.  History of hepatitis B.  Being followed by Dr. Alain Marion with recent blood work.  Liver functions were normal noting minimal elevated bilirubin at 1.4.  Was 1.8 last year.  She will continue to follow-up with him in reference to this.  Follow-up in 1 year for annual gynecologic exam.   Anastasio Auerbach MD, 8:21 AM 02/18/2017

## 2017-02-18 NOTE — Patient Instructions (Signed)
Follow-up in 1 year for annual exam, sooner if any issues. 

## 2017-02-19 ENCOUNTER — Other Ambulatory Visit: Payer: Self-pay | Admitting: Internal Medicine

## 2017-02-19 DIAGNOSIS — R928 Other abnormal and inconclusive findings on diagnostic imaging of breast: Secondary | ICD-10-CM

## 2017-02-21 ENCOUNTER — Encounter: Payer: Self-pay | Admitting: Internal Medicine

## 2017-03-02 ENCOUNTER — Encounter: Payer: Self-pay | Admitting: Internal Medicine

## 2017-03-05 ENCOUNTER — Encounter: Payer: Self-pay | Admitting: Internal Medicine

## 2017-03-06 ENCOUNTER — Other Ambulatory Visit: Payer: Self-pay

## 2017-03-06 NOTE — Progress Notes (Unsigned)
Opened in error

## 2017-06-25 ENCOUNTER — Telehealth: Payer: Self-pay | Admitting: Internal Medicine

## 2017-06-25 NOTE — Telephone Encounter (Signed)
Pt's Spouse, Shing Ru Wang dropped off [employer]  Physician Results Forms for completion for their health insurance.  Please fax completed forms per her spouse to [phone].  Forms placed in Brittany's box.

## 2017-06-29 NOTE — Telephone Encounter (Signed)
Form has been completed & placed in providers box to sign off on.

## 2017-07-03 NOTE — Telephone Encounter (Signed)
Forms have been signed, faxed to [facility] @ [phone], copy sent to scan.  Orignial mailed to patient.

## 2017-08-04 ENCOUNTER — Encounter: Payer: Self-pay | Admitting: Internal Medicine

## 2017-10-01 ENCOUNTER — Ambulatory Visit
Admission: RE | Admit: 2017-10-01 | Discharge: 2017-10-01 | Disposition: A | Discharge status: Home / Self Care | Payer: 59 | Source: Ambulatory Visit | Attending: Internal Medicine | Admitting: Internal Medicine

## 2017-10-01 ENCOUNTER — Other Ambulatory Visit: Payer: Self-pay | Admitting: Internal Medicine

## 2017-10-01 DIAGNOSIS — R928 Other abnormal and inconclusive findings on diagnostic imaging of breast: Secondary | ICD-10-CM | POA: Diagnosis not present

## 2017-10-01 DIAGNOSIS — N6001 Solitary cyst of right breast: Secondary | ICD-10-CM | POA: Diagnosis not present

## 2017-10-01 DIAGNOSIS — N6489 Other specified disorders of breast: Secondary | ICD-10-CM

## 2017-10-10 ENCOUNTER — Other Ambulatory Visit: Payer: Self-pay | Admitting: Internal Medicine

## 2017-10-14 ENCOUNTER — Other Ambulatory Visit: Payer: Self-pay | Admitting: Internal Medicine

## 2017-10-14 MED ORDER — ZOLPIDEM TARTRATE 5 MG PO TABS: ORAL_TABLET | ORAL | 1 refills | Status: DC

## 2017-11-02 ENCOUNTER — Ambulatory Visit: Payer: 59 | Admitting: Internal Medicine

## 2017-12-16 ENCOUNTER — Encounter: Payer: Self-pay | Admitting: Gynecology

## 2018-02-01 ENCOUNTER — Ambulatory Visit: Payer: 59 | Admitting: Internal Medicine

## 2018-02-01 ENCOUNTER — Other Ambulatory Visit (INDEPENDENT_AMBULATORY_CARE_PROVIDER_SITE_OTHER): Payer: 59

## 2018-02-01 ENCOUNTER — Encounter: Payer: Self-pay | Admitting: Internal Medicine

## 2018-02-01 VITALS — BP 128/74 | HR 76 | Temp 97.7°F | Ht 63.0 in | Wt 128.0 lb

## 2018-02-01 DIAGNOSIS — M533 Sacrococcygeal disorders, not elsewhere classified: Secondary | ICD-10-CM

## 2018-02-01 DIAGNOSIS — Z0000 Encounter for general adult medical examination without abnormal findings: Secondary | ICD-10-CM

## 2018-02-01 DIAGNOSIS — E559 Vitamin D deficiency, unspecified: Secondary | ICD-10-CM

## 2018-02-01 DIAGNOSIS — E538 Deficiency of other specified B group vitamins: Secondary | ICD-10-CM

## 2018-02-01 DIAGNOSIS — G8929 Other chronic pain: Secondary | ICD-10-CM

## 2018-02-01 LAB — BASIC METABOLIC PANEL
BUN: 9 mg/dL (ref 6–23)
CO2: 27 meq/L (ref 19–32)
Calcium: 9.5 mg/dL (ref 8.4–10.5)
Chloride: 103 mEq/L (ref 96–112)
Creatinine, Ser: 0.73 mg/dL (ref 0.40–1.20)
GFR: 81.54 mL/min (ref >60.00)
GLUCOSE: 93 mg/dL (ref 70–99)
POTASSIUM: 3.7 meq/L (ref 3.5–5.1)
Sodium: 141 mEq/L (ref 135–145)

## 2018-02-01 LAB — CBC WITH DIFFERENTIAL/PLATELET
Basophils Absolute: 0 10*3/uL (ref 0.0–0.1)
Basophils Relative: 0.8 % (ref 0.0–3.0)
EOS ABS: 0.2 10*3/uL (ref 0.0–0.7)
Eosinophils Relative: 2.7 % (ref 0.0–5.0)
HCT: 43 % (ref 36.0–46.0)
HEMOGLOBIN: 14.6 g/dL (ref 12.0–15.0)
Lymphocytes Relative: 35.8 % (ref 12.0–46.0)
Lymphs Abs: 2.1 10*3/uL (ref 0.7–4.0)
MCHC: 34 g/dL (ref 30.0–36.0)
MCV: 93.6 fl (ref 78.0–100.0)
MONO ABS: 0.5 10*3/uL (ref 0.1–1.0)
Monocytes Relative: 8.7 % (ref 3.0–12.0)
NEUTROS PCT: 52 % (ref 43.0–77.0)
Neutro Abs: 3 10*3/uL (ref 1.4–7.7)
Platelets: 215 10*3/uL (ref 150.0–400.0)
RBC: 4.6 Mil/uL (ref 3.87–5.11)
RDW: 13.1 % (ref 11.5–15.5)
WBC: 5.9 10*3/uL (ref 4.0–10.5)

## 2018-02-01 LAB — HEPATIC FUNCTION PANEL
ALT: 23 U/L (ref 0–35)
AST: 17 U/L (ref 0–37)
Albumin: 4.4 g/dL (ref 3.5–5.2)
Alkaline Phosphatase: 59 U/L (ref 39–117)
Bilirubin, Direct: 0.2 mg/dL (ref 0.0–0.3)
Total Bilirubin: 1.4 mg/dL — ABNORMAL HIGH (ref 0.2–1.2)
Total Protein: 7.4 g/dL (ref 6.0–8.3)

## 2018-02-01 LAB — URINALYSIS
BILIRUBIN URINE: NEGATIVE
KETONES UR: NEGATIVE
LEUKOCYTES UA: NEGATIVE
Nitrite: NEGATIVE
Specific Gravity, Urine: <=1.005 — AB (ref 1.000–1.030)
Total Protein, Urine: NEGATIVE
URINE GLUCOSE: NEGATIVE
UROBILINOGEN UA: 0.2 (ref 0.0–1.0)
pH: 7 (ref 5.0–8.0)

## 2018-02-01 LAB — LIPID PANEL
Cholesterol: 221 mg/dL — ABNORMAL HIGH (ref 0–200)
HDL: 62.1 mg/dL (ref >39.00)
NONHDL: 158.68
Total CHOL/HDL Ratio: 4
Triglycerides: 214 mg/dL — ABNORMAL HIGH (ref 0.0–149.0)
VLDL: 42.8 mg/dL — AB (ref 0.0–40.0)

## 2018-02-01 LAB — VITAMIN D 25 HYDROXY (VIT D DEFICIENCY, FRACTURES): VITD: 34.02 ng/mL (ref 30.00–100.00)

## 2018-02-01 LAB — LDL CHOLESTEROL, DIRECT: LDL DIRECT: 138 mg/dL

## 2018-02-01 LAB — TSH: TSH: 2.41 u[IU]/mL (ref 0.35–4.50)

## 2018-02-01 LAB — VITAMIN B12: VITAMIN B 12: 560 pg/mL (ref 211–911)

## 2018-02-01 MED ORDER — MELOXICAM 15 MG PO TABS: 15.0000 mg | ORAL_TABLET | Freq: Every day | ORAL | 0 refills | Status: DC

## 2018-02-01 MED ORDER — AZITHROMYCIN 250 MG PO TABS: ORAL_TABLET | ORAL | 0 refills | Status: DC

## 2018-02-01 MED ORDER — ZOLPIDEM TARTRATE 5 MG PO TABS: ORAL_TABLET | ORAL | 1 refills | Status: DC

## 2018-02-01 MED ORDER — VALACYCLOVIR HCL 1 G PO TABS: ORAL_TABLET | ORAL | 2 refills | Status: DC

## 2018-02-01 MED ORDER — PROMETHAZINE HCL 25 MG PO TABS: 25.0000 mg | ORAL_TABLET | Freq: Four times a day (QID) | ORAL | 2 refills | Status: DC | PRN

## 2018-02-01 NOTE — Assessment & Plan Note (Signed)
Vit D 

## 2018-02-01 NOTE — Assessment & Plan Note (Signed)
NSAIDs RO exercises

## 2018-02-01 NOTE — Assessment & Plan Note (Signed)
On B12 

## 2018-02-01 NOTE — Patient Instructions (Addendum)
Cardiac CT calcium scoring test $150   Computed tomography, more commonly known as a CT or CAT scan, is a diagnostic medical imaging test. Like traditional x-rays, it produces multiple images or pictures of the inside of the body. The cross-sectional images generated during a CT scan can be reformatted in multiple planes. They can even generate three-dimensional images. These images can be viewed on a computer monitor, printed on film or by a 3D printer, or transferred to a CD or DVD. CT images of internal organs, bones, soft tissue and blood vessels provide greater detail than traditional x-rays, particularly of soft tissues and blood vessels. A cardiac CT scan for coronary calcium is a non-invasive way of obtaining information about the presence, location and extent of calcified plaque in the coronary arteries-the vessels that supply oxygen-containing blood to the heart muscle. Calcified plaque results when there is a build-up of fat and other substances under the inner layer of the artery. This material can calcify which signals the presence of atherosclerosis, a disease of the vessel wall, also called coronary artery disease (CAD). People with this disease have an increased risk for heart attacks. In addition, over time, progression of plaque build up (CAD) can narrow the arteries or even close off blood flow to the heart. The result may be chest pain, sometimes called "angina," or a heart attack. Because calcium is a marker of CAD, the amount of calcium detected on a cardiac CT scan is a helpful prognostic tool. The findings on cardiac CT are expressed as a calcium score. Another name for this test is coronary artery calcium scoring.  What are some common uses of the procedure? The goal of cardiac CT scan for calcium scoring is to determine if CAD is present and to what extent, even if there are no symptoms. It is a screening study that may be recommended by a physician for patients with risk factors  for CAD but no clinical symptoms. The major risk factors for CAD are: . high blood cholesterol levels  . family history of heart attacks  . diabetes  . high blood pressure  . cigarette smoking  . overweight or obese  . physical inactivity   A negative cardiac CT scan for calcium scoring shows no calcification within the coronary arteries. This suggests that CAD is absent or so minimal it cannot be seen by this technique. The chance of having a heart attack over the next two to five years is very low under these circumstances. A positive test means that CAD is present, regardless of whether or not the patient is experiencing any symptoms. The amount of calcification-expressed as the calcium score-may help to predict the likelihood of a myocardial infarction (heart attack) in the coming years and helps your medical doctor or cardiologist decide whether the patient may need to take preventive medicine or undertake other measures such as diet and exercise to lower the risk for heart attack. The extent of CAD is graded according to your calcium score:  Calcium Score  Presence of CAD  0 No evidence of CAD   1-10 Minimal evidence of CAD  11-100 Mild evidence of CAD  101-400 Moderate evidence of CAD  Over 400 Extensive evidence of CAD    Sacroiliac Joint Dysfunction  Sacroiliac joint dysfunction is a condition that causes inflammation on one or both sides of the sacroiliac (SI) joint. The SI joint connects the lower part of the spine (sacrum) with the two upper portions of the pelvis (ilium). This  condition causes deep aching or burning pain in the low back. In some cases, the pain may also spread into one or both buttocks, hips, or thighs. What are the causes? This condition may be caused by:  Pregnancy. During pregnancy, extra stress is put on the SI joints because the pelvis widens.  Injury, such as: ? Injuries from car accidents. ? Sports-related injuries. ? Work-related  injuries.  Having one leg that is shorter than the other.  Conditions that affect the joints, such as: ? Rheumatoid arthritis. ? Gout. ? Psoriatic arthritis. ? Joint infection (septic arthritis). Sometimes, the cause of SI joint dysfunction is not known. What are the signs or symptoms? Symptoms of this condition include:  Aching or burning pain in the lower back. The pain may also spread to other areas, such as: ? Buttocks. ? Groin. ? Thighs.  Muscle spasms in or around the painful areas.  Increased pain when standing, walking, running, stair climbing, bending, or lifting. How is this diagnosed? This condition is diagnosed with a physical exam and medical history. During the exam, the health care provider may move one or both of your legs to different positions to check for pain. Various tests may be done to confirm the diagnosis, including:  Imaging tests to look for other causes of pain. These may include: ? MRI. ? CT scan. ? Bone scan.  Diagnostic injection. A numbing medicine is injected into the SI joint using a needle. If your pain is temporarily improved or stopped after the injection, this can indicate that SI joint dysfunction is the problem. How is this treated? Treatment depends on the cause and severity of your condition. Treatment options may include:  Ice or heat applied to the lower back area after an injury. This may help reduce pain and muscle spasms.  Medicines to relieve pain or inflammation or to relax the muscles.  Wearing a back brace (sacroiliac brace) to help support the joint while your back is healing.  Physical therapy to increase muscle strength around the joint and flexibility at the joint. This may also involve learning proper body positions and ways of moving to relieve stress on the joint.  Direct manipulation of the SI joint.  Injections of steroid medicine into the joint to reduce pain and swelling.  Radiofrequency ablation to burn away  nerves that are carrying pain messages from the joint.  Use of a device that provides electrical stimulation to help reduce pain at the joint.  Surgery to put in screws and plates that limit or prevent joint motion. This is rare. Follow these instructions at home: Medicines  Take over-the-counter and prescription medicines only as told by your health care provider.  Do not drive or use heavy machinery while taking prescription pain medicine.  If you are taking prescription pain medicine, take actions to prevent or treat constipation. Your health care provider may recommend that you: ? Drink enough fluid to keep your urine pale yellow. ? Eat foods that are high in fiber, such as fresh fruits and vegetables, whole grains, and beans. ? Limit foods that are high in fat and processed sugars, such as fried or sweet foods. ? Take an over-the-counter or prescription medicine for constipation. If you have a brace:  Wear the brace as told by your health care provider. Remove it only as told by your health care provider.  Keep the brace clean.  If the brace is not waterproof: ? Do not let it get wet. ? Cover it with  a watertight covering when you take a bath or a shower. Managing pain, stiffness, and swelling      Icing can help with pain and swelling. Heat may help with muscle tension or spasms. Ask your health care provider if you should use ice or heat.  If directed, put ice on the affected area: ? If you have a removable brace, remove it as told by your health care provider. ? Put ice in a plastic bag. ? Place a towel between your skin and the bag. ? Leave the ice on for 20 minutes, 2-3 times a day.  If directed, apply heat to the affected area. Use the heat source that your health care provider recommends, such as a moist heat pack or a heating pad. ? Place a towel between your skin and the heat source. ? Leave the heat on for 20-30 minutes. ? Remove the heat if your skin turns  bright red. This is especially important if you are unable to feel pain, heat, or cold. You may have a greater risk of getting burned. General instructions  Rest as needed. Ask your health care provider what activities are safe for you.  Return to your normal activities as told by your health care provider.  Exercise as directed by your health care provider or physical therapist.  Do not use any products that contain nicotine or tobacco, such as cigarettes and e-cigarettes. These can delay bone healing. If you need help quitting, ask your health care provider.  Keep all follow-up visits as told by your health care provider. This is important. Contact a health care provider if:  Your pain is not controlled with medicine.  You have a fever.  Your pain is getting worse. Get help right away if:  You have weakness, numbness, or tingling in your legs or feet.  You lose control of your bladder or bowel. Summary  Sacroiliac joint dysfunction is a condition that causes inflammation on one or both sides of the sacroiliac (SI) joint.  This condition causes deep aching or burning pain in the low back. In some cases, the pain may also spread into one or both buttocks, hips, or thighs.  Treatment depends on the cause and severity of your condition. It may include medicines to reduce pain and swelling or to relax muscles. This information is not intended to replace advice given to you by your health care provider. Make sure you discuss any questions you have with your health care provider. Document Released: 03/21/2008 Document Revised: 02/02/2017 Document Reviewed: 02/02/2017 Elsevier Interactive Patient Education  2019 [employer].

## 2018-02-01 NOTE — Progress Notes (Signed)
Subjective:  Patient ID: Amber Lowe, female    DOB: [birth year]  Age: 58 y.o. MRN: [id number]  CC: No chief complaint on file.   HPI [facility] presents for a well exam C/o UR sx Pt came from [place] last [place] ([place])  Outpatient Medications Prior to Visit  Medication Sig Dispense Refill  . aspirin EC 81 MG tablet Take 1 tablet (81 mg total) by mouth daily. 100 tablet 3  . b complex vitamins capsule Take 1 capsule by mouth daily. 100 capsule 3  . Cholecalciferol (VITAMIN D3) 2000 UNITS capsule Take 1 capsule (2,000 Units total) by mouth daily. 100 capsule 3  . triamcinolone cream (KENALOG) 0.5 % Apply 1 application topically 3 (three) times daily. 120 g 3  . zolpidem (AMBIEN) 5 MG tablet TAKE 1 TABLET AT BEDTIME AS NEEDED 90 tablet 1  . valACYclovir (VALTREX) 1000 MG tablet Take 2 pills twice daily for one day at onset of cold sore symptoms (Patient not taking: Reported on 02/18/2017) 60 tablet 2  . Cholecalciferol (VITAMIN D3) 50000 units CAPS Take 1 capsule by mouth once a week. (Patient not taking: Reported on 02/01/2018) 6 capsule 0  . linaclotide (LINZESS) 290 MCG CAPS capsule Take 1 capsule (290 mcg total) by mouth daily before breakfast. (Patient not taking: Reported on 02/01/2018) 90 capsule 3  . oseltamivir (TAMIFLU) 75 MG capsule Take 1 capsule (75 mg total) by mouth 2 (two) times daily. (Patient not taking: Reported on 02/18/2017) 10 capsule 0  . promethazine (PHENERGAN) 25 MG tablet Take 1 tablet (25 mg total) by mouth every 6 (six) hours as needed for nausea or vomiting. (Patient not taking: Reported on 02/18/2017) 60 tablet 2   Facility-Administered Medications Prior to Visit  Medication Dose Route Frequency Provider Last Rate Last Dose  . methylPREDNISolone acetate (DEPO-MEDROL) injection 40 mg  40 mg Intra-articular Once Plotnikov, Evie Lacks, MD        ROS: Review of Systems  Constitutional: Negative for activity change, appetite change, chills, diaphoresis, fatigue,  fever and unexpected weight change.  HENT: Negative for congestion, dental problem, ear pain, hearing loss, mouth sores, postnasal drip, sinus pressure, sneezing, sore throat and voice change.   Eyes: Negative for pain and visual disturbance.  Respiratory: Negative for cough, chest tightness, wheezing and stridor.   Cardiovascular: Negative for chest pain, palpitations and leg swelling.  Gastrointestinal: Negative for abdominal distention, abdominal pain, blood in stool, nausea, rectal pain and vomiting.  Genitourinary: Negative for decreased urine volume, difficulty urinating, dysuria, frequency, hematuria, menstrual problem, vaginal bleeding, vaginal discharge and vaginal pain.  Musculoskeletal: Positive for arthralgias. Negative for back pain, gait problem, joint swelling and neck pain.  Skin: Negative for color change, rash and wound.  Neurological: Negative for dizziness, tremors, syncope, speech difficulty, weakness and light-headedness.  Hematological: Negative for adenopathy.  Psychiatric/Behavioral: Positive for sleep disturbance. Negative for behavioral problems, confusion, decreased concentration, dysphoric mood, hallucinations and suicidal ideas. The patient is nervous/anxious. The patient is not hyperactive.     Objective:  BP 128/74 (BP Location: Left Arm, Patient Position: Sitting, Cuff Size: Normal)   Pulse 76   Temp 97.7 F (36.5 C) (Oral)   Ht 5\' 3"  (1.6 m)   Wt 128 lb (58.1 kg)   LMP 06/19/2012   SpO2 99%   BMI 22.67 kg/m   BP Readings from Last 3 Encounters:  02/01/18 128/74  02/18/17 118/74  02/16/17 120/82    Wt Readings from Last 3 Encounters:  02/01/18  128 lb (58.1 kg)  02/18/17 122 lb (55.3 kg)  02/16/17 122 lb (55.3 kg)    Physical Exam Constitutional:      General: She is not in acute distress.    Appearance: She is well-developed.  HENT:     Head: Normocephalic.     Right Ear: External ear normal.     Left Ear: External ear normal.     Nose:  Nose normal.  Eyes:     General:        Right eye: No discharge.        Left eye: No discharge.     Conjunctiva/sclera: Conjunctivae normal.     Pupils: Pupils are equal, round, and reactive to light.  Neck:     Musculoskeletal: Normal range of motion and neck supple.     Thyroid: No thyromegaly.     Vascular: No JVD.     Trachea: No tracheal deviation.  Cardiovascular:     Rate and Rhythm: Normal rate and regular rhythm.     Heart sounds: Normal heart sounds.  Pulmonary:     Effort: No respiratory distress.     Breath sounds: No stridor. No wheezing.  Abdominal:     General: Bowel sounds are normal. There is no distension.     Palpations: Abdomen is soft. There is no mass.     Tenderness: There is no abdominal tenderness. There is no guarding or rebound.  Musculoskeletal:        General: No tenderness.  Lymphadenopathy:     Cervical: No cervical adenopathy.  Skin:    Findings: No erythema or rash.  Neurological:     Cranial Nerves: No cranial nerve deficit.     Motor: No abnormal muscle tone.     Coordination: Coordination normal.     Deep Tendon Reflexes: Reflexes normal.  Psychiatric:        Behavior: Behavior normal.        Thought Content: Thought content normal.        Judgment: Judgment normal.   L SI joint pain  Lab Results  Component Value Date   WBC 4.6 02/17/2017   HGB 15.5 (H) 02/17/2017   HCT 45.3 02/17/2017   PLT 251.0 02/17/2017   GLUCOSE 104 (H) 02/17/2017   CHOL 191 02/17/2017   TRIG 91.0 02/17/2017   HDL 67.30 02/17/2017   LDLDIRECT 155.4 11/23/2012   LDLCALC 105 (H) 02/17/2017   ALT 20 02/17/2017   AST 17 02/17/2017   NA 139 02/17/2017   K 4.3 02/17/2017   CL 101 02/17/2017   CREATININE 0.73 02/17/2017   BUN 19 02/17/2017   CO2 30 02/17/2017   TSH 2.82 02/17/2017    US Breast Ltd Uni Right Inc Axilla  Result Date: 10/01/2017 CLINICAL DATA:  58 year old female recalled from screening mammogram dated 02/18/2017 for a possible right  breast mass. The patient states she had a right breast ultrasound in the interim in [place], which was negative per patient. Those images are not available today. EXAM: DIGITAL DIAGNOSTIC RIGHT MAMMOGRAM WITH CAD AND TOMO ULTRASOUND RIGHT BREAST COMPARISON:  Previous exam(s). ACR Breast Density Category b: There are scattered areas of fibroglandular density. FINDINGS: Previously described, possible mass persists as a focal asymmetry in the superior central right breast at mid to posterior depth. It has the appearance of focal fibroglandular tissue on additional 3D views and is not well seen on spot compression images. An oval, circumscribed equal density mass persists in the superior central right breast  at anterior depth. Further evaluation of both areas was performed with ultrasound. Mammographic images were processed with CAD. Targeted ultrasound is performed, showing an oval, circumscribed hypoechoic mass at the 12 o'clock position 5 cm from the nipple. It measures 6 x 4 x 3 mm. There is no internal vascularity. This likely corresponds with the mammographically identified mass. Evaluation of the entire superior right breast was performed, with no clear correlate for the mammographically identified asymmetry. IMPRESSION: 1. Probably benign focal asymmetry in the superior central right breast at posterior depth without ultrasound correlate. The appearance is suggestive of focal fibroglandular tissue on 3D and spot compression views. Recommendation is for short-term mammographic follow-up in February 2020 to coincide with the patient's annual bilateral mammogram. 2. Probably benign probable complicated cyst in the right breast at the 12 o'clock position 5 cm from the nipple. Recommendation is for sonographic follow-up in February 2020 to coincide with the patient's annual bilateral mammogram. RECOMMENDATION: Bilateral diagnostic mammogram and right breast ultrasound in February 2020. I have discussed the findings and  recommendations with the patient. Results were also provided in writing at the conclusion of the visit. If applicable, a reminder letter will be sent to the patient regarding the next appointment. BI-RADS CATEGORY  3: Probably benign. Electronically Signed   By: Kristopher Oppenheim M.D.   On: 10/01/2017 10:51   Mm Diag Breast Tomo Uni Right  Result Date: 10/01/2017 CLINICAL DATA:  58 year old female recalled from screening mammogram dated 02/18/2017 for a possible right breast mass. The patient states she had a right breast ultrasound in the interim in [place], which was negative per patient. Those images are not available today. EXAM: DIGITAL DIAGNOSTIC RIGHT MAMMOGRAM WITH CAD AND TOMO ULTRASOUND RIGHT BREAST COMPARISON:  Previous exam(s). ACR Breast Density Category b: There are scattered areas of fibroglandular density. FINDINGS: Previously described, possible mass persists as a focal asymmetry in the superior central right breast at mid to posterior depth. It has the appearance of focal fibroglandular tissue on additional 3D views and is not well seen on spot compression images. An oval, circumscribed equal density mass persists in the superior central right breast at anterior depth. Further evaluation of both areas was performed with ultrasound. Mammographic images were processed with CAD. Targeted ultrasound is performed, showing an oval, circumscribed hypoechoic mass at the 12 o'clock position 5 cm from the nipple. It measures 6 x 4 x 3 mm. There is no internal vascularity. This likely corresponds with the mammographically identified mass. Evaluation of the entire superior right breast was performed, with no clear correlate for the mammographically identified asymmetry. IMPRESSION: 1. Probably benign focal asymmetry in the superior central right breast at posterior depth without ultrasound correlate. The appearance is suggestive of focal fibroglandular tissue on 3D and spot compression views. Recommendation is  for short-term mammographic follow-up in February 2020 to coincide with the patient's annual bilateral mammogram. 2. Probably benign probable complicated cyst in the right breast at the 12 o'clock position 5 cm from the nipple. Recommendation is for sonographic follow-up in February 2020 to coincide with the patient's annual bilateral mammogram. RECOMMENDATION: Bilateral diagnostic mammogram and right breast ultrasound in February 2020. I have discussed the findings and recommendations with the patient. Results were also provided in writing at the conclusion of the visit. If applicable, a reminder letter will be sent to the patient regarding the next appointment. BI-RADS CATEGORY  3: Probably benign. Electronically Signed   By: Kristopher Oppenheim M.D.   On: 10/01/2017 10:51  Assessment & Plan:   There are no diagnoses linked to this encounter.   No orders of the defined types were placed in this encounter.    Follow-up: No follow-ups on file.  Walker Kehr, MD

## 2018-02-01 NOTE — Assessment & Plan Note (Addendum)
We discussed age appropriate health related issues, including available/recomended screening tests and vaccinations. We discussed a need for adhering to healthy diet and exercise. Labs/EKG were reviewed/ordered. All questions were answered. GYN - Dr Phineas Real Colon - Dr Collene Mares  Declined all shots Offered cardiac CT scan for calcium scoring

## 2018-02-03 ENCOUNTER — Other Ambulatory Visit: Payer: Self-pay | Admitting: Internal Medicine

## 2018-02-19 ENCOUNTER — Ambulatory Visit
Admission: RE | Admit: 2018-02-19 | Discharge: 2018-02-19 | Disposition: A | Discharge status: Home / Self Care | Payer: 59 | Source: Ambulatory Visit | Attending: Internal Medicine | Admitting: Internal Medicine

## 2018-02-19 ENCOUNTER — Encounter: Payer: 59 | Admitting: Gynecology

## 2018-02-19 DIAGNOSIS — N6489 Other specified disorders of breast: Secondary | ICD-10-CM

## 2018-02-19 DIAGNOSIS — N6311 Unspecified lump in the right breast, upper outer quadrant: Secondary | ICD-10-CM | POA: Diagnosis not present

## 2018-02-19 DIAGNOSIS — N6312 Unspecified lump in the right breast, upper inner quadrant: Secondary | ICD-10-CM | POA: Diagnosis not present

## 2018-02-19 DIAGNOSIS — R928 Other abnormal and inconclusive findings on diagnostic imaging of breast: Secondary | ICD-10-CM | POA: Diagnosis not present

## 2018-02-22 ENCOUNTER — Ambulatory Visit (INDEPENDENT_AMBULATORY_CARE_PROVIDER_SITE_OTHER): Payer: 59 | Admitting: Gynecology

## 2018-02-22 ENCOUNTER — Encounter: Payer: 59 | Admitting: Gynecology

## 2018-02-22 ENCOUNTER — Encounter: Payer: Self-pay | Admitting: Gynecology

## 2018-02-22 VITALS — BP 118/76 | Ht 61.5 in | Wt 125.0 lb

## 2018-02-22 DIAGNOSIS — N952 Postmenopausal atrophic vaginitis: Secondary | ICD-10-CM

## 2018-02-22 DIAGNOSIS — Z01419 Encounter for gynecological examination (general) (routine) without abnormal findings: Secondary | ICD-10-CM

## 2018-02-22 NOTE — Patient Instructions (Signed)
Follow-up in 1 year for annual exam, sooner if any issues. 

## 2018-02-22 NOTE — Progress Notes (Signed)
    Amber Lowe October 28, 1960 [id number]        58 y.o.  [id number] for annual gynecologic exam.  Without gynecologic complaints.  Past medical history,surgical history, problem list, medications, allergies, family history and social history were all reviewed and documented as reviewed in the EPIC chart.  ROS:  Performed with pertinent positives and negatives included in the history, assessment and plan.   Additional significant findings : None   Exam: [name] assistant Vitals:   02/22/18 1607  BP: 118/76  Weight: 125 lb (56.7 kg)  Height: 5' 1.5" (1.562 m)   Body mass index is 23.24 kg/m.  General appearance:  Normal affect, orientation and appearance. Skin: Grossly normal HEENT: Without gross lesions.  No cervical or supraclavicular adenopathy. Thyroid normal.  Lungs:  Clear without wheezing, rales or rhonchi Cardiac: RR, without RMG Abdominal:  Soft, nontender, without masses, guarding, rebound, organomegaly or hernia Breasts:  Examined lying and sitting without masses, retractions, discharge or axillary adenopathy. Pelvic:  Ext, BUS, Vagina: Normal with atrophic changes  Cervix: Normal with atrophic changes  Uterus: Anteverted, normal size, shape and contour, midline and mobile nontender   Adnexa: Without masses or tenderness    Anus and perineum: Normal   Rectovaginal: Normal sphincter tone without palpated masses or tenderness.    Assessment/Plan:  58 y.o. [id number] female for annual gynecologic exam.  1. Postmenopausal.  No significant menopausal symptoms or any vaginal bleeding. 2. Mammography last week.  Had area of right breast that they have been following which is stable.  They recommended a follow-up diagnostic mammogram and ultrasound in 12 months.  Breast exam normal today. 3. Pap smear 2018.  No Pap smear done today.  Plan repeat Pap smear next year at 3-year interval per current screening guidelines.  No history of abnormal Pap smears  previously. 4. Colonoscopy 2011.  Repeat at their recommended interval. 5. DEXA never.  Will plan further into the menopause. 6. Health maintenance.  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  Followed by Dr. Alain Marion.  Has had recent blood work.  Reports travel to [place] last month.  No signs or symptoms of URI/flu.  Follow-up in 1 year, sooner as needed.   Anastasio Auerbach MD, 4:38 PM 02/22/2018

## 2018-03-07 ENCOUNTER — Other Ambulatory Visit: Payer: Self-pay | Admitting: Internal Medicine

## 2018-04-04 ENCOUNTER — Other Ambulatory Visit: Payer: Self-pay | Admitting: Internal Medicine

## 2018-04-20 ENCOUNTER — Other Ambulatory Visit: Payer: Self-pay | Admitting: Internal Medicine

## 2018-05-16 ENCOUNTER — Other Ambulatory Visit: Payer: Self-pay | Admitting: Internal Medicine

## 2018-07-31 ENCOUNTER — Other Ambulatory Visit: Payer: Self-pay | Admitting: Internal Medicine

## 2018-08-27 ENCOUNTER — Other Ambulatory Visit: Payer: Self-pay | Admitting: Internal Medicine

## 2018-09-19 ENCOUNTER — Other Ambulatory Visit: Payer: Self-pay | Admitting: Internal Medicine

## 2018-09-19 DIAGNOSIS — Z789 Other specified health status: Secondary | ICD-10-CM

## 2018-09-24 NOTE — Telephone Encounter (Signed)
See other mychart message.

## 2018-10-05 ENCOUNTER — Encounter: Payer: Self-pay | Admitting: Gynecology

## 2019-06-27 ENCOUNTER — Other Ambulatory Visit: Payer: Self-pay | Admitting: Internal Medicine

## 2019-06-27 ENCOUNTER — Telehealth: Payer: Self-pay | Admitting: Internal Medicine

## 2019-06-27 DIAGNOSIS — N6001 Solitary cyst of right breast: Secondary | ICD-10-CM

## 2019-06-27 NOTE — Telephone Encounter (Signed)
New message:   Pt's spouse is calling and states that [facility] is needing an order placed for the pt to have a diagnostic mammogram. Please advise.

## 2019-06-27 NOTE — Telephone Encounter (Signed)
Okay.  Thanks.

## 2019-06-28 NOTE — Telephone Encounter (Signed)
Patient's husband informed of below via MyChart message.

## 2019-06-29 ENCOUNTER — Other Ambulatory Visit: Payer: Self-pay | Admitting: Internal Medicine

## 2019-06-29 DIAGNOSIS — N6001 Solitary cyst of right breast: Secondary | ICD-10-CM

## 2019-07-25 ENCOUNTER — Encounter: Payer: 59 | Admitting: Internal Medicine

## 2019-07-26 ENCOUNTER — Encounter: Payer: Self-pay | Admitting: Internal Medicine

## 2019-07-26 ENCOUNTER — Other Ambulatory Visit: Payer: Self-pay

## 2019-07-26 ENCOUNTER — Ambulatory Visit (INDEPENDENT_AMBULATORY_CARE_PROVIDER_SITE_OTHER): Payer: 59 | Admitting: Internal Medicine

## 2019-07-26 VITALS — BP 128/76 | HR 78 | Temp 98.2°F | Ht 61.5 in | Wt 123.0 lb

## 2019-07-26 DIAGNOSIS — E538 Deficiency of other specified B group vitamins: Secondary | ICD-10-CM

## 2019-07-26 DIAGNOSIS — Z0000 Encounter for general adult medical examination without abnormal findings: Secondary | ICD-10-CM

## 2019-07-26 DIAGNOSIS — G4700 Insomnia, unspecified: Secondary | ICD-10-CM

## 2019-07-26 DIAGNOSIS — F411 Generalized anxiety disorder: Secondary | ICD-10-CM

## 2019-07-26 DIAGNOSIS — E559 Vitamin D deficiency, unspecified: Secondary | ICD-10-CM

## 2019-07-26 MED ORDER — VALACYCLOVIR HCL 1 G PO TABS: ORAL_TABLET | ORAL | 2 refills | Status: DC

## 2019-07-26 MED ORDER — MELOXICAM 15 MG PO TABS: 15.0000 mg | ORAL_TABLET | Freq: Every day | ORAL | 1 refills | Status: DC

## 2019-07-26 MED ORDER — ZOLPIDEM TARTRATE 5 MG PO TABS: ORAL_TABLET | ORAL | 1 refills | Status: DC

## 2019-07-26 MED ORDER — PROMETHAZINE HCL 25 MG PO TABS: 25.0000 mg | ORAL_TABLET | Freq: Four times a day (QID) | ORAL | 2 refills | Status: DC | PRN

## 2019-07-26 NOTE — Assessment & Plan Note (Signed)
Vit D 

## 2019-07-26 NOTE — Assessment & Plan Note (Signed)
Zolpidem prn  Potential benefits of a long term benzodiazepines  use as well as potential risks  and complications were explained to the patient and were aknowledged. 

## 2019-07-26 NOTE — Assessment & Plan Note (Signed)
On B12 

## 2019-07-26 NOTE — Progress Notes (Signed)
Subjective:  Patient ID: Amber Lowe, female    DOB: [birth year]  Age: 59 y.o. MRN: [id number]  CC: No chief complaint on file.   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 azithromycin (ZITHROMAX Z-PAK) 250 MG tablet As directed 6 tablet 0  . b complex vitamins capsule Take 1 capsule by mouth daily. 100 capsule 3  . Cholecalciferol (VITAMIN D3) 1.25 MG (50000 UT) CAPS TAKE 1 CAPSULE BY MOUTH ONCE A WEEK 4 capsule 1  . Cholecalciferol (VITAMIN D3) 2000 UNITS capsule Take 1 capsule (2,000 Units total) by mouth daily. 100 capsule 3  . meloxicam (MOBIC) 15 MG tablet TAKE 1 TABLET BY MOUTH EVERY DAY 30 tablet 0  . nitrofurantoin (MACRODANTIN) 100 MG capsule Take 100 mg by mouth 4 (four) times daily.    . promethazine (PHENERGAN) 25 MG tablet Take 1 tablet (25 mg total) by mouth every 6 (six) hours as needed for nausea or vomiting. 60 tablet 2  . triamcinolone cream (KENALOG) 0.5 % Apply 1 application topically 3 (three) times daily.    . valACYclovir (VALTREX) 1000 MG tablet TAKE 2 PILLS TWICE DAILY FOR ONE DAY AT ONSET OF COLD SORE SYMPTOMS 60 tablet 2  . zolpidem (AMBIEN) 5 MG tablet TAKE 1 TABLET AT BEDTIME AS NEEDED 90 tablet 1   No facility-administered medications prior to visit.    ROS: Review of Systems  Constitutional: Negative for activity change, appetite change, chills, fatigue and unexpected weight change.  HENT: Negative for congestion, mouth sores and sinus pressure.   Eyes: Negative for visual disturbance.  Respiratory: Negative for cough and chest tightness.   Gastrointestinal: Negative for abdominal pain and nausea.  Genitourinary: Negative for difficulty urinating, frequency and vaginal pain.  Musculoskeletal: Negative for back pain and gait problem.  Skin: Negative for pallor and rash.  Neurological: Negative for dizziness,  tremors, weakness, numbness and headaches.  Psychiatric/Behavioral: Positive for dysphoric mood. Negative for confusion and sleep disturbance. The patient is nervous/anxious.     Objective:  BP 128/76 (BP Location: Left Arm, Patient Position: Sitting, Cuff Size: Normal)   Pulse 78   Temp 98.2 F (36.8 C) (Oral)   Ht 5' 1.5" (1.562 m)   Wt 123 lb (55.8 kg)   LMP 06/19/2012   SpO2 99%   BMI 22.86 kg/m   BP Readings from Last 3 Encounters:  07/26/19 128/76  02/22/18 118/76  02/01/18 128/74    Wt Readings from Last 3 Encounters:  07/26/19 123 lb (55.8 kg)  02/22/18 125 lb (56.7 kg)  02/01/18 128 lb (58.1 kg)    Physical Exam Constitutional:      General: She is not in acute distress.    Appearance: She is well-developed.  HENT:     Head: Normocephalic.     Right Ear: External ear normal.     Left Ear: External ear normal.     Nose: Nose normal.  Eyes:     General:        Right eye: No discharge.        Left eye: No discharge.     Conjunctiva/sclera: Conjunctivae normal.     Pupils: Pupils are equal, round, and reactive to light.  Neck:     Thyroid: No thyromegaly.     Vascular: No JVD.     Trachea: No tracheal deviation.  Cardiovascular:     Rate and Rhythm: Normal rate and regular rhythm.     Heart sounds: Normal heart sounds.  Pulmonary:     Effort: No respiratory distress.     Breath sounds: No stridor. No wheezing.  Abdominal:     General: Bowel sounds are normal. There is no distension.     Palpations: Abdomen is soft. There is no mass.     Tenderness: There is no abdominal tenderness. There is no guarding or rebound.  Musculoskeletal:        General: No tenderness.     Cervical back: Normal range of motion and neck supple.  Lymphadenopathy:     Cervical: No cervical adenopathy.  Skin:    Findings: No erythema or rash.  Neurological:     Cranial Nerves: No cranial nerve deficit.     Motor: No abnormal muscle tone.     Coordination: Coordination  normal.     Deep Tendon Reflexes: Reflexes normal.  Psychiatric:        Behavior: Behavior normal.        Thought Content: Thought content normal.        Judgment: Judgment normal.     Lab Results  Component Value Date   WBC 5.9 02/01/2018   HGB 14.6 02/01/2018   HCT 43.0 02/01/2018   PLT 215.0 02/01/2018   GLUCOSE 93 02/01/2018   CHOL 221 (H) 02/01/2018   TRIG 214.0 (H) 02/01/2018   HDL 62.10 02/01/2018   LDLDIRECT 138.0 02/01/2018   LDLCALC 105 (H) 02/17/2017   ALT 23 02/01/2018   AST 17 02/01/2018   NA 141 02/01/2018   K 3.7 02/01/2018   CL 103 02/01/2018   CREATININE 0.73 02/01/2018   BUN 9 02/01/2018   CO2 27 02/01/2018   TSH 2.41 02/01/2018    US BREAST LTD UNI RIGHT INC AXILLA  Result Date: 02/19/2018 CLINICAL DATA:  Follow-up for probably benign asymmetry in the upper RIGHT breast and probably benign complicated cyst in the RIGHT breast at the 12 o'clock axis. The mammographic finding was originally identified in February of 2018. The sonographic finding was originally identified in September of 2019. EXAM: DIGITAL DIAGNOSTIC BILATERAL MAMMOGRAM WITH CAD AND TOMO ULTRASOUND RIGHT BREAST COMPARISON:  Previous exam(s). ACR Breast Density Category b: There are scattered areas of fibroglandular density. FINDINGS: On today's diagnostic exam, there is no persistent asymmetry within the upper RIGHT breast. There are no new dominant masses, suspicious calcifications or secondary signs of malignancy within either breast. Mammographic images were processed with CAD. Targeted ultrasound is performed, again showing an oval circumscribed hypoechoic mass in the RIGHT breast at the 12 o'clock axis, 5 cm from the nipple, measuring 5 x 3 x 4 mm, not significantly changed compared to the previous ultrasound of 10/01/2017, again most suggestive of a complicated cyst or small fibroadenoma. IMPRESSION: 1. Stable probably benign mass within the RIGHT breast at the 12 o'clock axis, 5 cm from the  nipple, measuring 5 mm, most suggestive of a complicated cyst or small benign fibroadenoma. Recommend additional follow-up diagnostic mammogram and ultrasound in 12 months to ensure continued stability. 2. No evidence of malignancy within the LEFT breast. RECOMMENDATION: Bilateral diagnostic mammogram, and RIGHT breast ultrasound, in 12 months. I have discussed the findings and recommendations with the patient. Results were also provided in writing at the conclusion of the visit. If applicable, a reminder letter will be sent to the patient regarding the next appointment. BI-RADS CATEGORY  3: Probably benign. Electronically  Signed   By: Franki Cabot M.D.   On: 02/19/2018 09:48   MM DIAG BREAST TOMO BILATERAL  Result Date: 02/19/2018 CLINICAL DATA:  Follow-up for probably benign asymmetry in the upper RIGHT breast and probably benign complicated cyst in the RIGHT breast at the 12 o'clock axis. The mammographic finding was originally identified in February of 2018. The sonographic finding was originally identified in September of 2019. EXAM: DIGITAL DIAGNOSTIC BILATERAL MAMMOGRAM WITH CAD AND TOMO ULTRASOUND RIGHT BREAST COMPARISON:  Previous exam(s). ACR Breast Density Category b: There are scattered areas of fibroglandular density. FINDINGS: On today's diagnostic exam, there is no persistent asymmetry within the upper RIGHT breast. There are no new dominant masses, suspicious calcifications or secondary signs of malignancy within either breast. Mammographic images were processed with CAD. Targeted ultrasound is performed, again showing an oval circumscribed hypoechoic mass in the RIGHT breast at the 12 o'clock axis, 5 cm from the nipple, measuring 5 x 3 x 4 mm, not significantly changed compared to the previous ultrasound of 10/01/2017, again most suggestive of a complicated cyst or small fibroadenoma. IMPRESSION: 1. Stable probably benign mass within the RIGHT breast at the 12 o'clock axis, 5 cm from the  nipple, measuring 5 mm, most suggestive of a complicated cyst or small benign fibroadenoma. Recommend additional follow-up diagnostic mammogram and ultrasound in 12 months to ensure continued stability. 2. No evidence of malignancy within the LEFT breast. RECOMMENDATION: Bilateral diagnostic mammogram, and RIGHT breast ultrasound, in 12 months. I have discussed the findings and recommendations with the patient. Results were also provided in writing at the conclusion of the visit. If applicable, a reminder letter will be sent to the patient regarding the next appointment. BI-RADS CATEGORY  3: Probably benign. Electronically Signed   By: Franki Cabot M.D.   On: 02/19/2018 09:48    Assessment & Plan:    Walker Kehr, MD

## 2019-07-26 NOTE — Assessment & Plan Note (Addendum)
We discussed age appropriate health related issues, including available/recomended screening tests and vaccinations. We discussed a need for adhering to healthy diet and exercise. Labs/EKG were ordered. All questions were answered. GYN - Dr Phineas Real Colon - Dr Collene Mares Declined all shots A  cardiac CT scan for calcium scoring offered

## 2019-07-26 NOTE — Patient Instructions (Signed)

## 2019-07-26 NOTE — Assessment & Plan Note (Signed)
Off meds 

## 2019-07-27 ENCOUNTER — Ambulatory Visit
Admission: RE | Admit: 2019-07-27 | Discharge: 2019-07-27 | Disposition: A | Discharge status: Home / Self Care | Payer: 59 | Source: Ambulatory Visit | Attending: Internal Medicine | Admitting: Internal Medicine

## 2019-07-27 DIAGNOSIS — N6001 Solitary cyst of right breast: Secondary | ICD-10-CM

## 2019-07-27 LAB — LIPID PANEL
Cholesterol: 215 mg/dL — ABNORMAL HIGH (ref <200)
HDL: 67 mg/dL (ref >=50)
LDL Cholesterol (Calc): 128 mg/dL (calc) — ABNORMAL HIGH
Non-HDL Cholesterol (Calc): 148 mg/dL (calc) — ABNORMAL HIGH (ref <130)
Total CHOL/HDL Ratio: 3.2 (calc) (ref <5.0)
Triglycerides: 97 mg/dL (ref <150)

## 2019-07-27 LAB — CBC WITH DIFFERENTIAL/PLATELET
Absolute Monocytes: 382 cells/uL (ref 200–950)
Basophils Absolute: 69 cells/uL (ref 0–200)
Basophils Relative: 1.4 %
Eosinophils Absolute: 69 cells/uL (ref 15–500)
Eosinophils Relative: 1.4 %
HCT: 42.4 % (ref 35.0–45.0)
Hemoglobin: 14.3 g/dL (ref 11.7–15.5)
Lymphs Abs: 2470 cells/uL (ref 850–3900)
MCH: 31.9 pg (ref 27.0–33.0)
MCHC: 33.7 g/dL (ref 32.0–36.0)
MCV: 94.6 fL (ref 80.0–100.0)
MPV: 9.2 fL (ref 7.5–12.5)
Monocytes Relative: 7.8 %
Neutro Abs: 1911 cells/uL (ref 1500–7800)
Neutrophils Relative %: 39 %
Platelets: 247 10*3/uL (ref 140–400)
RBC: 4.48 10*6/uL (ref 3.80–5.10)
RDW: 13.2 % (ref 11.0–15.0)
Total Lymphocyte: 50.4 %
WBC: 4.9 10*3/uL (ref 3.8–10.8)

## 2019-07-27 LAB — VITAMIN B12: Vitamin B-12: 536 pg/mL (ref 200–1100)

## 2019-07-27 LAB — COMPLETE METABOLIC PANEL WITH GFR
AG Ratio: 1.9 (calc) (ref 1.0–2.5)
ALT: 17 U/L (ref 6–29)
AST: 17 U/L (ref 10–35)
Albumin: 4.7 g/dL (ref 3.6–5.1)
Alkaline phosphatase (APISO): 47 U/L (ref 37–153)
BUN: 11 mg/dL (ref 7–25)
CO2: 28 mmol/L (ref 20–32)
Calcium: 9.4 mg/dL (ref 8.6–10.4)
Chloride: 104 mmol/L (ref 98–110)
Creat: 0.75 mg/dL (ref 0.50–0.99)
GFR, Est African American: 100 mL/min/{1.73_m2} (ref >=60)
GFR, Est Non African American: 87 mL/min/{1.73_m2} (ref >=60)
Globulin: 2.5 g/dL (calc) (ref 1.9–3.7)
Glucose, Bld: 89 mg/dL (ref 65–99)
Potassium: 4.2 mmol/L (ref 3.5–5.3)
Sodium: 141 mmol/L (ref 135–146)
Total Bilirubin: 1.8 mg/dL — ABNORMAL HIGH (ref 0.2–1.2)
Total Protein: 7.2 g/dL (ref 6.1–8.1)

## 2019-07-27 LAB — URINALYSIS
Bilirubin Urine: NEGATIVE
Glucose, UA: NEGATIVE
Leukocytes,Ua: NEGATIVE
Nitrite: NEGATIVE
Protein, ur: NEGATIVE
Specific Gravity, Urine: 1.006 (ref 1.001–1.03)
pH: 7 (ref 5.0–8.0)

## 2019-07-27 LAB — TSH: TSH: 1.59 mIU/L (ref 0.40–4.50)

## 2019-07-27 LAB — VITAMIN D 25 HYDROXY (VIT D DEFICIENCY, FRACTURES): Vit D, 25-Hydroxy: 28 ng/mL — ABNORMAL LOW (ref 30–100)

## 2019-07-28 ENCOUNTER — Other Ambulatory Visit: Payer: Self-pay | Admitting: Internal Medicine

## 2019-07-28 MED ORDER — VITAMIN D3 1.25 MG (50000 UT) PO CAPS: 1.0000 | ORAL_CAPSULE | ORAL | 0 refills | Status: DC

## 2019-07-28 MED ORDER — VITAMIN D3 50 MCG (2000 UT) PO CAPS
4000.0000 [IU] | ORAL_CAPSULE | Freq: Every day | ORAL | 3 refills | Status: AC
Start: 2019-07-28 — End: ?

## 2019-08-22 ENCOUNTER — Other Ambulatory Visit: Payer: Self-pay | Admitting: Internal Medicine

## 2019-09-22 ENCOUNTER — Other Ambulatory Visit: Payer: Self-pay | Admitting: Internal Medicine

## 2019-10-18 ENCOUNTER — Other Ambulatory Visit: Payer: Self-pay | Admitting: Internal Medicine

## 2019-10-20 ENCOUNTER — Other Ambulatory Visit: Payer: Self-pay | Admitting: Internal Medicine

## 2019-11-28 ENCOUNTER — Other Ambulatory Visit: Payer: Self-pay | Admitting: Internal Medicine

## 2020-02-15 ENCOUNTER — Other Ambulatory Visit: Payer: Self-pay | Admitting: Internal Medicine

## 2020-02-15 NOTE — Telephone Encounter (Signed)
Check  registry last filled 07/26/2019.Marland KitchenJohny Lowe

## 2020-02-22 ENCOUNTER — Other Ambulatory Visit: Payer: Self-pay | Admitting: Internal Medicine

## 2020-02-22 MED ORDER — ZOLPIDEM TARTRATE 5 MG PO TABS: ORAL_TABLET | ORAL | 1 refills | Status: DC

## 2020-06-20 ENCOUNTER — Other Ambulatory Visit: Payer: Self-pay | Admitting: Internal Medicine

## 2020-06-20 DIAGNOSIS — Z1231 Encounter for screening mammogram for malignant neoplasm of breast: Secondary | ICD-10-CM

## 2020-07-26 ENCOUNTER — Encounter: Payer: Self-pay | Admitting: Internal Medicine

## 2020-07-26 ENCOUNTER — Ambulatory Visit (INDEPENDENT_AMBULATORY_CARE_PROVIDER_SITE_OTHER): Payer: 59

## 2020-07-26 ENCOUNTER — Other Ambulatory Visit: Payer: Self-pay

## 2020-07-26 ENCOUNTER — Ambulatory Visit (INDEPENDENT_AMBULATORY_CARE_PROVIDER_SITE_OTHER): Payer: 59 | Admitting: Internal Medicine

## 2020-07-26 VITALS — BP 132/80 | HR 73 | Temp 98.1°F | Ht 61.5 in | Wt 129.4 lb

## 2020-07-26 DIAGNOSIS — Z0000 Encounter for general adult medical examination without abnormal findings: Secondary | ICD-10-CM

## 2020-07-26 DIAGNOSIS — N951 Menopausal and female climacteric states: Secondary | ICD-10-CM

## 2020-07-26 DIAGNOSIS — E559 Vitamin D deficiency, unspecified: Secondary | ICD-10-CM

## 2020-07-26 DIAGNOSIS — E538 Deficiency of other specified B group vitamins: Secondary | ICD-10-CM

## 2020-07-26 DIAGNOSIS — F411 Generalized anxiety disorder: Secondary | ICD-10-CM

## 2020-07-26 LAB — LIPID PANEL
Cholesterol: 206 mg/dL — ABNORMAL HIGH (ref 0–200)
HDL: 70.1 mg/dL (ref >39.00)
LDL Cholesterol: 112 mg/dL — ABNORMAL HIGH (ref 0–99)
NonHDL: 135.41
Total CHOL/HDL Ratio: 3
Triglycerides: 115 mg/dL (ref 0.0–149.0)
VLDL: 23 mg/dL (ref 0.0–40.0)

## 2020-07-26 LAB — CBC WITH DIFFERENTIAL/PLATELET
Basophils Absolute: 0 10*3/uL (ref 0.0–0.1)
Basophils Relative: 0.5 % (ref 0.0–3.0)
Eosinophils Absolute: 0 10*3/uL (ref 0.0–0.7)
Eosinophils Relative: 0.8 % (ref 0.0–5.0)
HCT: 41.5 % (ref 36.0–46.0)
Hemoglobin: 14 g/dL (ref 12.0–15.0)
Lymphocytes Relative: 36.2 % (ref 12.0–46.0)
Lymphs Abs: 1.8 10*3/uL (ref 0.7–4.0)
MCHC: 33.8 g/dL (ref 30.0–36.0)
MCV: 96 fl (ref 78.0–100.0)
Monocytes Absolute: 0.4 10*3/uL (ref 0.1–1.0)
Monocytes Relative: 7.5 % (ref 3.0–12.0)
Neutro Abs: 2.8 10*3/uL (ref 1.4–7.7)
Neutrophils Relative %: 55 % (ref 43.0–77.0)
Platelets: 240 10*3/uL (ref 150.0–400.0)
RBC: 4.33 Mil/uL (ref 3.87–5.11)
RDW: 13.3 % (ref 11.5–15.5)
WBC: 5.1 10*3/uL (ref 4.0–10.5)

## 2020-07-26 LAB — URINALYSIS
Bilirubin Urine: NEGATIVE
Leukocytes,Ua: NEGATIVE
Nitrite: NEGATIVE
Specific Gravity, Urine: <=1.005 — AB (ref 1.000–1.030)
Total Protein, Urine: NEGATIVE
Urine Glucose: NEGATIVE
Urobilinogen, UA: 0.2 (ref 0.0–1.0)
pH: 6 (ref 5.0–8.0)

## 2020-07-26 LAB — T4, FREE: Free T4: 0.85 ng/dL (ref 0.60–1.60)

## 2020-07-26 LAB — COMPREHENSIVE METABOLIC PANEL
ALT: 18 U/L (ref 0–35)
AST: 18 U/L (ref 0–37)
Albumin: 4.6 g/dL (ref 3.5–5.2)
Alkaline Phosphatase: 53 U/L (ref 39–117)
BUN: 13 mg/dL (ref 6–23)
CO2: 27 mEq/L (ref 19–32)
Calcium: 9.5 mg/dL (ref 8.4–10.5)
Chloride: 103 mEq/L (ref 96–112)
Creatinine, Ser: 0.81 mg/dL (ref 0.40–1.20)
GFR: 78.19 mL/min (ref >60.00)
Glucose, Bld: 90 mg/dL (ref 70–99)
Potassium: 3.9 mEq/L (ref 3.5–5.1)
Sodium: 140 mEq/L (ref 135–145)
Total Bilirubin: 1.8 mg/dL — ABNORMAL HIGH (ref 0.2–1.2)
Total Protein: 7.4 g/dL (ref 6.0–8.3)

## 2020-07-26 LAB — VITAMIN D 25 HYDROXY (VIT D DEFICIENCY, FRACTURES): VITD: 38.08 ng/mL (ref 30.00–100.00)

## 2020-07-26 LAB — TSH: TSH: 0.6 u[IU]/mL (ref 0.35–5.50)

## 2020-07-26 LAB — VITAMIN B12: Vitamin B-12: 436 pg/mL (ref 211–911)

## 2020-07-26 MED ORDER — ZOLPIDEM TARTRATE 5 MG PO TABS: ORAL_TABLET | ORAL | 1 refills | Status: DC

## 2020-07-26 MED ORDER — PROMETHAZINE HCL 25 MG PO TABS: 25.0000 mg | ORAL_TABLET | Freq: Four times a day (QID) | ORAL | 2 refills | Status: DC | PRN

## 2020-07-26 MED ORDER — VALACYCLOVIR HCL 1 G PO TABS: 1000.0000 mg | ORAL_TABLET | Freq: Two times a day (BID) | ORAL | 2 refills | Status: DC

## 2020-07-26 NOTE — Assessment & Plan Note (Signed)
On Vit D 

## 2020-07-26 NOTE — Assessment & Plan Note (Signed)
On B12 

## 2020-07-26 NOTE — Progress Notes (Addendum)
Subjective:  Patient ID: Amber Lowe, female    DOB: 1960/11/26  Age: 60 y.o. MRN: [id number]  CC: Annual Exam (Need Health form completed ) and Medication Refill (Req refill on ambien, promethazine, and valtrex)   HPI [facility] presents for a well exam C/o sweats C/o wt gain  Outpatient Medications Prior to Visit  Medication Sig Dispense Refill   b complex vitamins capsule Take 1 capsule by mouth daily. 100 capsule 3   Cholecalciferol (VITAMIN D3) 50 MCG (2000 UT) capsule Take 2 capsules (4,000 Units total) by mouth daily. 100 capsule 3   meloxicam (MOBIC) 15 MG tablet TAKE 1 TABLET BY MOUTH EVERY DAY 30 tablet 1   triamcinolone cream (KENALOG) 0.5 % Apply 1 application topically 3 (three) times daily.     promethazine (PHENERGAN) 25 MG tablet Take 1 tablet (25 mg total) by mouth every 6 (six) hours as needed for nausea or vomiting. 60 tablet 2   valACYclovir (VALTREX) 1000 MG tablet AS DIRECTED 60 tablet 2   zolpidem (AMBIEN) 5 MG tablet TAKE 1 TABLET AT BEDTIME AS NEEDED 90 tablet 1   azithromycin (ZITHROMAX Z-PAK) 250 MG tablet As directed 6 tablet 0   Cholecalciferol (VITAMIN D3) 1.25 MG (50000 UT) CAPS TAKE 1 CAPSULE BY MOUTH ONE TIME PER WEEK (Patient not taking: Reported on 07/26/2020) 4 capsule 1   nitrofurantoin (MACRODANTIN) 100 MG capsule Take 100 mg by mouth 4 (four) times daily. (Patient not taking: Reported on 07/26/2020)     No facility-administered medications prior to visit.    ROS: Review of Systems  Constitutional:  Positive for diaphoresis and unexpected weight change. Negative for activity change, appetite change, chills and fatigue.  HENT:  Negative for congestion, mouth sores and sinus pressure.   Eyes:  Negative for visual disturbance.  Respiratory:  Negative for cough and chest tightness.   Gastrointestinal:  Negative for abdominal pain and nausea.  Genitourinary:  Negative for difficulty urinating, frequency and vaginal pain.  Musculoskeletal:   Negative for back pain and gait problem.  Skin:  Negative for pallor and rash.  Neurological:  Negative for dizziness, tremors, weakness, numbness and headaches.  Psychiatric/Behavioral:  Negative for confusion and sleep disturbance.    Objective:  BP 132/80 (BP Location: Left Arm)   Pulse 73   Temp 98.1 F (36.7 C) (Oral)   Ht 5' 1.5" (1.562 m)   Wt 129 lb 6.4 oz (58.7 kg)   LMP 06/19/2012   SpO2 99%   BMI 24.05 kg/m   BP Readings from Last 3 Encounters:  07/26/20 132/80  07/26/19 128/76  02/22/18 118/76    Wt Readings from Last 3 Encounters:  07/26/20 129 lb 6.4 oz (58.7 kg)  07/26/19 123 lb (55.8 kg)  02/22/18 125 lb (56.7 kg)    Physical Exam Constitutional:      General: She is not in acute distress.    Appearance: She is well-developed.  HENT:     Head: Normocephalic.     Right Ear: External ear normal.     Left Ear: External ear normal.     Nose: Nose normal.  Eyes:     General:        Right eye: No discharge.        Left eye: No discharge.     Conjunctiva/sclera: Conjunctivae normal.     Pupils: Pupils are equal, round, and reactive to light.  Neck:     Thyroid: No thyromegaly.     Vascular:  No JVD.     Trachea: No tracheal deviation.  Cardiovascular:     Rate and Rhythm: Normal rate and regular rhythm.     Heart sounds: Normal heart sounds.  Pulmonary:     Effort: No respiratory distress.     Breath sounds: No stridor. No wheezing.  Abdominal:     General: Bowel sounds are normal. There is no distension.     Palpations: Abdomen is soft. There is no mass.     Tenderness: There is no abdominal tenderness. There is no guarding or rebound.  Musculoskeletal:        General: No tenderness.     Cervical back: Normal range of motion and neck supple. No rigidity.  Lymphadenopathy:     Cervical: No cervical adenopathy.  Skin:    Findings: No erythema or rash.  Neurological:     Cranial Nerves: No cranial nerve deficit.     Motor: No abnormal muscle  tone.     Coordination: Coordination normal.     Deep Tendon Reflexes: Reflexes normal.  Psychiatric:        Behavior: Behavior normal.        Thought Content: Thought content normal.        Judgment: Judgment normal.    Lab Results  Component Value Date   WBC 4.9 07/26/2019   HGB 14.3 07/26/2019   HCT 42.4 07/26/2019   PLT 247 07/26/2019   GLUCOSE 89 07/26/2019   CHOL 215 (H) 07/26/2019   TRIG 97 07/26/2019   HDL 67 07/26/2019   LDLDIRECT 138.0 02/01/2018   LDLCALC 128 (H) 07/26/2019   ALT 17 07/26/2019   AST 17 07/26/2019   NA 141 07/26/2019   K 4.2 07/26/2019   CL 104 07/26/2019   CREATININE 0.75 07/26/2019   BUN 11 07/26/2019   CO2 28 07/26/2019   TSH 1.59 07/26/2019    US BREAST LTD UNI RIGHT INC AXILLA  Result Date: 07/27/2019 CLINICAL DATA:  Follow-up probably benign mass in the 12 o'clock position of the right breast. EXAM: DIGITAL DIAGNOSTIC BILATERAL MAMMOGRAM WITH CAD AND TOMO ULTRASOUND RIGHT BREAST COMPARISON:  Previous exam(s). ACR Breast Density Category b: There are scattered areas of fibroglandular density. FINDINGS: A small, oval, circumscribed mass in the upper outer right breast, anteriorly, is unchanged. No interval findings suspicious for malignancy in either breast. Mammographic images were processed with CAD. Targeted ultrasound is performed, showing a 5 x 4 x 2 mm oval, hypoechoic, circumscribed mass with mildly irregular margins, without change compared to the previous examinations dating back to 10/01/2017. As position for ultrasound, this is in the 12 o'clock position of the breast, 5 cm from the nipple. IMPRESSION: 1. Stable probably benign right breast mass. 2. No evidence of malignancy elsewhere in either breast. RECOMMENDATION: Bilateral diagnostic mammogram and right breast ultrasound in 1 year. This will complete greater than 2 years of follow-up of the probably benign right breast mass. I have discussed the findings and recommendations with the  patient. If applicable, a reminder letter will be sent to the patient regarding the next appointment. BI-RADS CATEGORY  3: Probably benign. Electronically Signed   By: Claudie Revering M.D.   On: 07/27/2019 11:43   MM DIAG BREAST TOMO BILATERAL  Result Date: 07/27/2019 CLINICAL DATA:  Follow-up probably benign mass in the 12 o'clock position of the right breast. EXAM: DIGITAL DIAGNOSTIC BILATERAL MAMMOGRAM WITH CAD AND TOMO ULTRASOUND RIGHT BREAST COMPARISON:  Previous exam(s). ACR Breast Density Category b: There are scattered  areas of fibroglandular density. FINDINGS: A small, oval, circumscribed mass in the upper outer right breast, anteriorly, is unchanged. No interval findings suspicious for malignancy in either breast. Mammographic images were processed with CAD. Targeted ultrasound is performed, showing a 5 x 4 x 2 mm oval, hypoechoic, circumscribed mass with mildly irregular margins, without change compared to the previous examinations dating back to 10/01/2017. As position for ultrasound, this is in the 12 o'clock position of the breast, 5 cm from the nipple. IMPRESSION: 1. Stable probably benign right breast mass. 2. No evidence of malignancy elsewhere in either breast. RECOMMENDATION: Bilateral diagnostic mammogram and right breast ultrasound in 1 year. This will complete greater than 2 years of follow-up of the probably benign right breast mass. I have discussed the findings and recommendations with the patient. If applicable, a reminder letter will be sent to the patient regarding the next appointment. BI-RADS CATEGORY  3: Probably benign. Electronically Signed   By: Claudie Revering M.D.   On: 07/27/2019 11:43    Assessment & Plan:   There are no diagnoses linked to this encounter.   Meds ordered this encounter  Medications   promethazine (PHENERGAN) 25 MG tablet    Sig: Take 1 tablet (25 mg total) by mouth every 6 (six) hours as needed for nausea or vomiting.    Dispense:  60 tablet     Refill:  2   valACYclovir (VALTREX) 1000 MG tablet    Sig: Take 1 tablet (1,000 mg total) by mouth 2 (two) times daily.    Dispense:  60 tablet    Refill:  2   zolpidem (AMBIEN) 5 MG tablet    Sig: TAKE 1 TABLET AT BEDTIME AS NEEDED    Dispense:  90 tablet    Refill:  1      Walker Kehr, MD

## 2020-07-26 NOTE — Patient Instructions (Addendum)
Niacin or nicotinic acid can cause flushing  For a mild COVID-19 case - take zinc 50 mg a day for 1 week, vitamin C 1000 mg daily for 1 week, vitamin D2 50,000 units weekly for 2 months (unless  taking vitamin D daily already), an antioxidant Quercetin 500 mg twice a day for 1 week (if you can get it quick enough). Take Allegra or Benadryl.  Maintain good oral hydration and take Tylenol for high fever.  Call if problems. Isolate for 5 days, then wear a mask for 5 days per CDC.  Novovax  Start yoga

## 2020-07-26 NOTE — Assessment & Plan Note (Addendum)
Probably menopausal - check Vit D --  Niacin or nicotinic acid can cause flushing Will obtain a chest x-ray

## 2020-07-29 NOTE — Assessment & Plan Note (Signed)

## 2020-08-13 ENCOUNTER — Other Ambulatory Visit: Payer: Self-pay | Admitting: Internal Medicine

## 2020-08-13 DIAGNOSIS — N63 Unspecified lump in unspecified breast: Secondary | ICD-10-CM

## 2020-08-16 ENCOUNTER — Ambulatory Visit
Admission: RE | Admit: 2020-08-16 | Discharge: 2020-08-16 | Disposition: A | Discharge status: Home / Self Care | Payer: 59 | Source: Ambulatory Visit | Attending: Internal Medicine | Admitting: Internal Medicine

## 2020-08-16 ENCOUNTER — Other Ambulatory Visit: Payer: Self-pay

## 2020-08-16 DIAGNOSIS — N63 Unspecified lump in unspecified breast: Secondary | ICD-10-CM

## 2021-01-28 ENCOUNTER — Other Ambulatory Visit: Payer: Self-pay | Admitting: Internal Medicine

## 2021-03-04 ENCOUNTER — Ambulatory Visit: Payer: 59 | Admitting: Internal Medicine

## 2021-03-05 ENCOUNTER — Other Ambulatory Visit: Payer: Self-pay | Admitting: Internal Medicine

## 2021-03-11 ENCOUNTER — Ambulatory Visit (INDEPENDENT_AMBULATORY_CARE_PROVIDER_SITE_OTHER): Payer: 59

## 2021-03-11 ENCOUNTER — Other Ambulatory Visit: Payer: Self-pay

## 2021-03-11 DIAGNOSIS — Z23 Encounter for immunization: Secondary | ICD-10-CM

## 2021-03-11 NOTE — Progress Notes (Addendum)
Verbal consent given for patient to receive Shingles vaccine ? ?After obtaining consent, and per orders of Dr. Alain Marion, injection of Shingrix given by Archie Balboa in left deltoid. Patient instructed to report any adverse reaction to me immediately. ? ?Medical screening examination/treatment/procedure(s) were performed by non-physician practitioner and as supervising physician I was immediately available for consultation/collaboration.  I agree with above. Lew Dawes, MD ? ?

## 2021-08-19 ENCOUNTER — Other Ambulatory Visit: Payer: Self-pay | Admitting: Internal Medicine

## 2021-08-19 DIAGNOSIS — Z1231 Encounter for screening mammogram for malignant neoplasm of breast: Secondary | ICD-10-CM

## 2021-08-27 ENCOUNTER — Other Ambulatory Visit: Payer: Self-pay | Admitting: Internal Medicine

## 2021-08-30 ENCOUNTER — Other Ambulatory Visit: Payer: Self-pay | Admitting: Internal Medicine

## 2021-09-13 ENCOUNTER — Ambulatory Visit: Payer: 59

## 2021-09-16 ENCOUNTER — Ambulatory Visit: Payer: 59

## 2021-09-16 ENCOUNTER — Ambulatory Visit (INDEPENDENT_AMBULATORY_CARE_PROVIDER_SITE_OTHER): Payer: 59 | Admitting: Internal Medicine

## 2021-09-16 ENCOUNTER — Encounter: Payer: Self-pay | Admitting: Internal Medicine

## 2021-09-16 VITALS — BP 110/72 | HR 72 | Temp 98.5°F | Ht 61.5 in | Wt 123.2 lb

## 2021-09-16 DIAGNOSIS — Z0000 Encounter for general adult medical examination without abnormal findings: Secondary | ICD-10-CM

## 2021-09-16 DIAGNOSIS — G4700 Insomnia, unspecified: Secondary | ICD-10-CM | POA: Diagnosis not present

## 2021-09-16 DIAGNOSIS — Z23 Encounter for immunization: Secondary | ICD-10-CM | POA: Diagnosis not present

## 2021-09-16 DIAGNOSIS — E559 Vitamin D deficiency, unspecified: Secondary | ICD-10-CM | POA: Diagnosis not present

## 2021-09-16 DIAGNOSIS — E538 Deficiency of other specified B group vitamins: Secondary | ICD-10-CM

## 2021-09-16 LAB — COMPREHENSIVE METABOLIC PANEL
ALT: 19 U/L (ref 0–35)
AST: 18 U/L (ref 0–37)
Albumin: 4.3 g/dL (ref 3.5–5.2)
Alkaline Phosphatase: 50 U/L (ref 39–117)
BUN: 17 mg/dL (ref 6–23)
CO2: 28 mEq/L (ref 19–32)
Calcium: 9.3 mg/dL (ref 8.4–10.5)
Chloride: 103 mEq/L (ref 96–112)
Creatinine, Ser: 0.87 mg/dL (ref 0.40–1.20)
GFR: 71.19 mL/min (ref >60.00)
Glucose, Bld: 90 mg/dL (ref 70–99)
Potassium: 3.7 mEq/L (ref 3.5–5.1)
Sodium: 139 mEq/L (ref 135–145)
Total Bilirubin: 1.8 mg/dL — ABNORMAL HIGH (ref 0.2–1.2)
Total Protein: 7.4 g/dL (ref 6.0–8.3)

## 2021-09-16 LAB — URINALYSIS
Bilirubin Urine: NEGATIVE
Ketones, ur: NEGATIVE
Leukocytes,Ua: NEGATIVE
Nitrite: NEGATIVE
Specific Gravity, Urine: 1.015 (ref 1.000–1.030)
Total Protein, Urine: NEGATIVE
Urine Glucose: NEGATIVE
Urobilinogen, UA: 0.2 (ref 0.0–1.0)
pH: 6 (ref 5.0–8.0)

## 2021-09-16 LAB — CBC WITH DIFFERENTIAL/PLATELET
Basophils Absolute: 0.1 10*3/uL (ref 0.0–0.1)
Basophils Relative: 1.1 % (ref 0.0–3.0)
Eosinophils Absolute: 0.2 10*3/uL (ref 0.0–0.7)
Eosinophils Relative: 3.2 % (ref 0.0–5.0)
HCT: 42.8 % (ref 36.0–46.0)
Hemoglobin: 14.6 g/dL (ref 12.0–15.0)
Lymphocytes Relative: 52.2 % — ABNORMAL HIGH (ref 12.0–46.0)
Lymphs Abs: 2.6 10*3/uL (ref 0.7–4.0)
MCHC: 34 g/dL (ref 30.0–36.0)
MCV: 94.7 fl (ref 78.0–100.0)
Monocytes Absolute: 0.3 10*3/uL (ref 0.1–1.0)
Monocytes Relative: 6.2 % (ref 3.0–12.0)
Neutro Abs: 1.8 10*3/uL (ref 1.4–7.7)
Neutrophils Relative %: 37.3 % — ABNORMAL LOW (ref 43.0–77.0)
Platelets: 228 10*3/uL (ref 150.0–400.0)
RBC: 4.52 Mil/uL (ref 3.87–5.11)
RDW: 13.8 % (ref 11.5–15.5)
WBC: 4.9 10*3/uL (ref 4.0–10.5)

## 2021-09-16 LAB — VITAMIN B12: Vitamin B-12: 527 pg/mL (ref 211–911)

## 2021-09-16 LAB — LIPID PANEL
Cholesterol: 201 mg/dL — ABNORMAL HIGH (ref 0–200)
HDL: 69.7 mg/dL (ref >39.00)
LDL Cholesterol: 108 mg/dL — ABNORMAL HIGH (ref 0–99)
NonHDL: 130.94
Total CHOL/HDL Ratio: 3
Triglycerides: 117 mg/dL (ref 0.0–149.0)
VLDL: 23.4 mg/dL (ref 0.0–40.0)

## 2021-09-16 LAB — TSH: TSH: 2.58 u[IU]/mL (ref 0.35–5.50)

## 2021-09-16 LAB — VITAMIN D 25 HYDROXY (VIT D DEFICIENCY, FRACTURES): VITD: 32.59 ng/mL (ref 30.00–100.00)

## 2021-09-16 MED ORDER — PROMETHAZINE HCL 25 MG PO TABS: 25.0000 mg | ORAL_TABLET | Freq: Four times a day (QID) | ORAL | 2 refills | Status: DC | PRN

## 2021-09-16 MED ORDER — VALACYCLOVIR HCL 1 G PO TABS: 1000.0000 mg | ORAL_TABLET | Freq: Two times a day (BID) | ORAL | 2 refills | Status: DC

## 2021-09-16 MED ORDER — ZOLPIDEM TARTRATE 5 MG PO TABS: ORAL_TABLET | ORAL | 1 refills | Status: DC

## 2021-09-16 MED ORDER — AZITHROMYCIN 250 MG PO TABS: ORAL_TABLET | ORAL | 0 refills | Status: DC

## 2021-09-16 NOTE — Progress Notes (Signed)
Subjective:  Patient ID: Amber Lowe, female    DOB: 23-Sep-1960  Age: 61 y.o. MRN: [id number]  CC: Annual Exam (Need second shingle )   HPI Amber Lowe presents for well exam  Outpatient Medications Prior to Visit  Medication Sig Dispense Refill   b complex vitamins capsule Take 1 capsule by mouth daily. 100 capsule 3   Cholecalciferol (VITAMIN D3) 50 MCG (2000 UT) capsule Take 2 capsules (4,000 Units total) by mouth daily. 100 capsule 3   meloxicam (MOBIC) 15 MG tablet TAKE 1 TABLET BY MOUTH EVERY DAY 30 tablet 1   triamcinolone cream (KENALOG) 0.5 % Apply 1 application topically 3 (three) times daily.     promethazine (PHENERGAN) 25 MG tablet TAKE 1 TABLET BY MOUTH EVERY 6 HOURS AS NEEDED FOR NAUSEA OR VOMITING. 60 tablet 2   valACYclovir (VALTREX) 1000 MG tablet Take 1 tablet (1,000 mg total) by mouth 2 (two) times daily. 60 tablet 2   zolpidem (AMBIEN) 5 MG tablet TAKE 1 TABLET BY MOUTH EVERY DAY AT BEDTIME AS NEEDED 90 tablet 1   No facility-administered medications prior to visit.    ROS: Review of Systems  Constitutional:  Negative for activity change, appetite change, chills, fatigue and unexpected weight change.  HENT:  Negative for congestion, mouth sores and sinus pressure.   Eyes:  Negative for visual disturbance.  Respiratory:  Negative for cough and chest tightness.   Gastrointestinal:  Negative for abdominal pain and nausea.  Genitourinary:  Negative for difficulty urinating, frequency and vaginal pain.  Musculoskeletal:  Negative for back pain and gait problem.  Skin:  Negative for pallor and rash.  Neurological:  Negative for dizziness, tremors, weakness, numbness and headaches.  Psychiatric/Behavioral:  Positive for sleep disturbance. Negative for confusion.     Objective:  BP 110/72 (BP Location: Left Arm)   Pulse 72   Temp 98.5 F (36.9 C) (Oral)   Ht 5' 1.5" (1.562 m)   Wt 123 lb 3.2 oz (55.9 kg)   LMP 06/19/2012   SpO2 95%   BMI 22.90 kg/m    BP Readings from Last 3 Encounters:  09/16/21 110/72  07/26/20 132/80  07/26/19 128/76    Wt Readings from Last 3 Encounters:  09/16/21 123 lb 3.2 oz (55.9 kg)  07/26/20 129 lb 6.4 oz (58.7 kg)  07/26/19 123 lb (55.8 kg)    Physical Exam Constitutional:      General: She is not in acute distress.    Appearance: She is well-developed.  HENT:     Head: Normocephalic.     Right Ear: External ear normal.     Left Ear: External ear normal.     Nose: Nose normal.  Eyes:     General:        Right eye: No discharge.        Left eye: No discharge.     Conjunctiva/sclera: Conjunctivae normal.     Pupils: Pupils are equal, round, and reactive to light.  Neck:     Thyroid: No thyromegaly.     Vascular: No JVD.     Trachea: No tracheal deviation.  Cardiovascular:     Rate and Rhythm: Normal rate and regular rhythm.     Heart sounds: Normal heart sounds.  Pulmonary:     Effort: No respiratory distress.     Breath sounds: No stridor. No wheezing.  Abdominal:     General: Bowel sounds are normal. There is no distension.  Palpations: Abdomen is soft. There is no mass.     Tenderness: There is no abdominal tenderness. There is no guarding or rebound.  Musculoskeletal:        General: No tenderness.     Cervical back: Normal range of motion and neck supple. No rigidity.  Lymphadenopathy:     Cervical: No cervical adenopathy.  Skin:    Findings: No erythema or rash.  Neurological:     Cranial Nerves: No cranial nerve deficit.     Motor: No abnormal muscle tone.     Coordination: Coordination normal.     Deep Tendon Reflexes: Reflexes normal.  Psychiatric:        Behavior: Behavior normal.        Thought Content: Thought content normal.        Judgment: Judgment normal.     Lab Results  Component Value Date   WBC 5.1 07/26/2020   HGB 14.0 07/26/2020   HCT 41.5 07/26/2020   PLT 240.0 07/26/2020   GLUCOSE 90 07/26/2020   CHOL 206 (H) 07/26/2020   TRIG 115.0  07/26/2020   HDL 70.10 07/26/2020   LDLDIRECT 138.0 02/01/2018   LDLCALC 112 (H) 07/26/2020   ALT 18 07/26/2020   AST 18 07/26/2020   NA 140 07/26/2020   K 3.9 07/26/2020   CL 103 07/26/2020   CREATININE 0.81 07/26/2020   BUN 13 07/26/2020   CO2 27 07/26/2020   TSH 0.60 07/26/2020    MM DIAG BREAST TOMO BILATERAL  Result Date: 08/16/2020 CLINICAL DATA:  61 year old female presenting for follow-up of a likely benign mass in the right breast at 12 o'clock. EXAM: DIGITAL DIAGNOSTIC BILATERAL MAMMOGRAM WITH TOMOSYNTHESIS AND CAD; ULTRASOUND RIGHT BREAST LIMITED TECHNIQUE: Bilateral digital diagnostic mammography and breast tomosynthesis was performed. The images were evaluated with computer-aided detection.; Targeted ultrasound examination of the right breast was performed COMPARISON:  Previous exam(s). ACR Breast Density Category b: There are scattered areas of fibroglandular density. FINDINGS: The small oval mass in the superior right breast is unchanged. No suspicious calcifications, masses or areas of distortion are seen in the bilateral breasts. Ultrasound targeted to the right breast at 12 o'clock, 5 cm from the nipple demonstrates a stable oval hypoechoic mass measuring 6 x 3 x 4 mm, previously measuring 6 x 2 x 4 mm in September of 2019. IMPRESSION: 1. The right breast mass at 12 o'clock has demonstrated 2 years of stability, and is therefore benign. 2.  No mammographic evidence of malignancy in the bilateral breasts. RECOMMENDATION: Screening mammogram in one year.(Code:SM-B-01Y) I have discussed the findings and recommendations with the patient. If applicable, a reminder letter will be sent to the patient regarding the next appointment. BI-RADS CATEGORY  2: Benign. Electronically Signed   By: Ammie Ferrier M.D.   On: 08/16/2020 09:04   US BREAST LTD UNI RIGHT INC AXILLA  Result Date: 08/16/2020 CLINICAL DATA:  61 year old female presenting for follow-up of a likely benign mass in the  right breast at 12 o'clock. EXAM: DIGITAL DIAGNOSTIC BILATERAL MAMMOGRAM WITH TOMOSYNTHESIS AND CAD; ULTRASOUND RIGHT BREAST LIMITED TECHNIQUE: Bilateral digital diagnostic mammography and breast tomosynthesis was performed. The images were evaluated with computer-aided detection.; Targeted ultrasound examination of the right breast was performed COMPARISON:  Previous exam(s). ACR Breast Density Category b: There are scattered areas of fibroglandular density. FINDINGS: The small oval mass in the superior right breast is unchanged. No suspicious calcifications, masses or areas of distortion are seen in the bilateral breasts. Ultrasound targeted to  the right breast at 12 o'clock, 5 cm from the nipple demonstrates a stable oval hypoechoic mass measuring 6 x 3 x 4 mm, previously measuring 6 x 2 x 4 mm in September of 2019. IMPRESSION: 1. The right breast mass at 12 o'clock has demonstrated 2 years of stability, and is therefore benign. 2.  No mammographic evidence of malignancy in the bilateral breasts. RECOMMENDATION: Screening mammogram in one year.(Code:SM-B-01Y) I have discussed the findings and recommendations with the patient. If applicable, a reminder letter will be sent to the patient regarding the next appointment. BI-RADS CATEGORY  2: Benign. Electronically Signed   By: Ammie Ferrier M.D.   On: 08/16/2020 09:04   Assessment & Plan:   Problem List Items Addressed This Visit     Insomnia    Zolpidem prn  Potential benefits of a long term benzodiazepines  use as well as potential risks  and complications were explained to the patient and were aknowledged.      Vitamin B 12 deficiency    On B12      Relevant Orders   Vitamin B12   Vitamin D deficiency    On Vit D      Relevant Orders   VITAMIN D 25 Hydroxy (Vit-D Deficiency, Fractures)   Well adult exam - Primary     We discussed age appropriate health related issues, including available/recomended screening tests and vaccinations.  Labs were ordered to be later reviewed . All questions were answered. We discussed one or more of the following - seat belt use, use of sunscreen/sun exposure exercise,  fall risk reduction, second hand smoke exposure, firearm use and storage, seat belt use, a need for adhering to healthy diet and exercise. Labs were ordered.  All questions were answered. GYN - Dr Phineas Real Colon - Dr Collene Mares Declined all shots      Relevant Orders   TSH   Urinalysis   CBC with Differential/Platelet   Lipid panel   Comprehensive metabolic panel   Vitamin E31   VITAMIN D 25 Hydroxy (Vit-D Deficiency, Fractures)      Meds ordered this encounter  Medications   zolpidem (AMBIEN) 5 MG tablet    Sig: TAKE 1 TABLET BY MOUTH EVERY DAY AT BEDTIME AS NEEDED    Dispense:  90 tablet    Refill:  1    This request is for a new prescription for a controlled substance as required by Federal/State law.   valACYclovir (VALTREX) 1000 MG tablet    Sig: Take 1 tablet (1,000 mg total) by mouth 2 (two) times daily.    Dispense:  60 tablet    Refill:  2   promethazine (PHENERGAN) 25 MG tablet    Sig: Take 1 tablet (25 mg total) by mouth every 6 (six) hours as needed.    Dispense:  60 tablet    Refill:  2   azithromycin (ZITHROMAX Z-PAK) 250 MG tablet    Sig: As directed    Dispense:  6 tablet    Refill:  0      Follow-up: Return in about 6 months (around 03/17/2022) for a follow-up visit.  Walker Kehr, MD

## 2021-09-16 NOTE — Assessment & Plan Note (Signed)
Zolpidem prn  Potential benefits of a long term benzodiazepines  use as well as potential risks  and complications were explained to the patient and were aknowledged. 

## 2021-09-16 NOTE — Assessment & Plan Note (Signed)
On B12 

## 2021-09-16 NOTE — Assessment & Plan Note (Signed)
  We discussed age appropriate health related issues, including available/recomended screening tests and vaccinations. Labs were ordered to be later reviewed . All questions were answered. We discussed one or more of the following - seat belt use, use of sunscreen/sun exposure exercise,  fall risk reduction, second hand smoke exposure, firearm use and storage, seat belt use, a need for adhering to healthy diet and exercise. Labs were ordered.  All questions were answered. GYN - Dr Phineas Real Colon - Dr Collene Mares Declined all shots

## 2021-09-16 NOTE — Assessment & Plan Note (Signed)
On Vit D 

## 2021-09-16 NOTE — Assessment & Plan Note (Signed)
Pt declined a w/up

## 2021-09-17 ENCOUNTER — Ambulatory Visit
Admission: RE | Admit: 2021-09-17 | Discharge: 2021-09-17 | Disposition: A | Discharge status: Home / Self Care | Payer: 59 | Source: Ambulatory Visit | Attending: Internal Medicine | Admitting: Internal Medicine

## 2021-09-17 DIAGNOSIS — Z1231 Encounter for screening mammogram for malignant neoplasm of breast: Secondary | ICD-10-CM

## 2021-12-12 ENCOUNTER — Other Ambulatory Visit: Payer: Self-pay | Admitting: Internal Medicine

## 2022-01-22 ENCOUNTER — Other Ambulatory Visit: Payer: Self-pay | Admitting: Internal Medicine

## 2022-03-04 ENCOUNTER — Telehealth: Payer: Self-pay | Admitting: Internal Medicine

## 2022-03-04 NOTE — Telephone Encounter (Signed)
Okay Prevnar 20.  Thanks

## 2022-03-04 NOTE — Telephone Encounter (Signed)
Patient would like to get a pneumonia vaccine. Since there are different types, she was informed that she would get a call back to be scheduled. Best callback is [phone]

## 2022-03-04 NOTE — Telephone Encounter (Signed)
Pls advise which one./lmb

## 2022-03-05 ENCOUNTER — Ambulatory Visit (INDEPENDENT_AMBULATORY_CARE_PROVIDER_SITE_OTHER): Payer: 59 | Admitting: *Deleted

## 2022-03-05 DIAGNOSIS — Z23 Encounter for immunization: Secondary | ICD-10-CM | POA: Diagnosis not present

## 2022-03-05 NOTE — Telephone Encounter (Signed)
Called pt gave her MD response. Made ppt for this afternoon for inj.Marland KitchenJohny Lowe

## 2022-08-18 ENCOUNTER — Other Ambulatory Visit: Payer: Self-pay | Admitting: Internal Medicine

## 2022-08-18 DIAGNOSIS — Z1231 Encounter for screening mammogram for malignant neoplasm of breast: Secondary | ICD-10-CM

## 2022-08-21 ENCOUNTER — Encounter (INDEPENDENT_AMBULATORY_CARE_PROVIDER_SITE_OTHER): Payer: Self-pay

## 2022-10-01 ENCOUNTER — Ambulatory Visit
Admission: RE | Admit: 2022-10-01 | Discharge: 2022-10-01 | Disposition: A | Discharge status: Home / Self Care | Payer: 59 | Source: Ambulatory Visit | Attending: Internal Medicine | Admitting: Internal Medicine

## 2022-10-01 DIAGNOSIS — Z1231 Encounter for screening mammogram for malignant neoplasm of breast: Secondary | ICD-10-CM

## 2022-10-06 ENCOUNTER — Ambulatory Visit (INDEPENDENT_AMBULATORY_CARE_PROVIDER_SITE_OTHER): Payer: 59 | Admitting: Internal Medicine

## 2022-10-06 ENCOUNTER — Encounter: Payer: Self-pay | Admitting: Internal Medicine

## 2022-10-06 VITALS — BP 142/96 | HR 64 | Temp 97.7°F | Ht 61.5 in | Wt 124.0 lb

## 2022-10-06 DIAGNOSIS — Z23 Encounter for immunization: Secondary | ICD-10-CM | POA: Diagnosis not present

## 2022-10-06 DIAGNOSIS — E538 Deficiency of other specified B group vitamins: Secondary | ICD-10-CM

## 2022-10-06 DIAGNOSIS — Z13220 Encounter for screening for lipoid disorders: Secondary | ICD-10-CM

## 2022-10-06 DIAGNOSIS — Z0000 Encounter for general adult medical examination without abnormal findings: Secondary | ICD-10-CM

## 2022-10-06 DIAGNOSIS — E559 Vitamin D deficiency, unspecified: Secondary | ICD-10-CM

## 2022-10-06 LAB — URINALYSIS
Bilirubin Urine: NEGATIVE
Ketones, ur: NEGATIVE
Leukocytes,Ua: NEGATIVE
Nitrite: NEGATIVE
Specific Gravity, Urine: <=1.005 — AB (ref 1.000–1.030)
Total Protein, Urine: NEGATIVE
Urine Glucose: NEGATIVE
Urobilinogen, UA: 0.2 (ref 0.0–1.0)
pH: 7 (ref 5.0–8.0)

## 2022-10-06 LAB — CBC WITH DIFFERENTIAL/PLATELET
Basophils Absolute: 0 10*3/uL (ref 0.0–0.1)
Basophils Relative: 0.9 % (ref 0.0–3.0)
Eosinophils Absolute: 0.1 10*3/uL (ref 0.0–0.7)
Eosinophils Relative: 1.7 % (ref 0.0–5.0)
HCT: 43.9 % (ref 36.0–46.0)
Hemoglobin: 14.8 g/dL (ref 12.0–15.0)
Lymphocytes Relative: 40.9 % (ref 12.0–46.0)
Lymphs Abs: 1.4 10*3/uL (ref 0.7–4.0)
MCHC: 33.8 g/dL (ref 30.0–36.0)
MCV: 95.1 fL (ref 78.0–100.0)
Monocytes Absolute: 0.2 10*3/uL (ref 0.1–1.0)
Monocytes Relative: 7.1 % (ref 3.0–12.0)
Neutro Abs: 1.6 10*3/uL (ref 1.4–7.7)
Neutrophils Relative %: 49.4 % (ref 43.0–77.0)
Platelets: 239 10*3/uL (ref 150.0–400.0)
RBC: 4.62 Mil/uL (ref 3.87–5.11)
RDW: 13.5 % (ref 11.5–15.5)
WBC: 3.3 10*3/uL — ABNORMAL LOW (ref 4.0–10.5)

## 2022-10-06 LAB — COMPREHENSIVE METABOLIC PANEL
ALT: 19 U/L (ref 0–35)
AST: 19 U/L (ref 0–37)
Albumin: 4.6 g/dL (ref 3.5–5.2)
Alkaline Phosphatase: 51 U/L (ref 39–117)
BUN: 13 mg/dL (ref 6–23)
CO2: 27 meq/L (ref 19–32)
Calcium: 9.5 mg/dL (ref 8.4–10.5)
Chloride: 105 meq/L (ref 96–112)
Creatinine, Ser: 0.75 mg/dL (ref 0.40–1.20)
GFR: 84.45 mL/min (ref >60.00)
Glucose, Bld: 96 mg/dL (ref 70–99)
Potassium: 3.9 meq/L (ref 3.5–5.1)
Sodium: 141 meq/L (ref 135–145)
Total Bilirubin: 1.7 mg/dL — ABNORMAL HIGH (ref 0.2–1.2)
Total Protein: 7.5 g/dL (ref 6.0–8.3)

## 2022-10-06 LAB — VITAMIN D 25 HYDROXY (VIT D DEFICIENCY, FRACTURES): VITD: 43.59 ng/mL (ref 30.00–100.00)

## 2022-10-06 LAB — LIPID PANEL
Cholesterol: 194 mg/dL (ref 0–200)
HDL: 69.6 mg/dL (ref >39.00)
LDL Cholesterol: 105 mg/dL — ABNORMAL HIGH (ref 0–99)
NonHDL: 124.62
Total CHOL/HDL Ratio: 3
Triglycerides: 97 mg/dL (ref 0.0–149.0)
VLDL: 19.4 mg/dL (ref 0.0–40.0)

## 2022-10-06 LAB — VITAMIN B12: Vitamin B-12: 638 pg/mL (ref 211–911)

## 2022-10-06 LAB — TSH: TSH: 1.72 u[IU]/mL (ref 0.35–5.50)

## 2022-10-06 MED ORDER — AZITHROMYCIN 250 MG PO TABS: ORAL_TABLET | ORAL | 0 refills | Status: DC

## 2022-10-06 MED ORDER — PROMETHAZINE HCL 25 MG PO TABS: 25.0000 mg | ORAL_TABLET | Freq: Four times a day (QID) | ORAL | 2 refills | Status: DC | PRN

## 2022-10-06 MED ORDER — ZOLPIDEM TARTRATE 5 MG PO TABS: ORAL_TABLET | ORAL | 1 refills | Status: DC

## 2022-10-06 NOTE — Assessment & Plan Note (Addendum)
  We discussed age appropriate health related issues, including available/recomended screening tests and vaccinations. Labs were ordered to be later reviewed . All questions were answered. We discussed one or more of the following - seat belt use, use of sunscreen/sun exposure exercise,  fall risk reduction, second hand smoke exposure, firearm use and storage, seat belt use, a need for adhering to healthy diet and exercise. Labs were ordered.  All questions were answered. GYN - Dr Audie Box retired Colon - Dr Loreta Ave Declined all shots Offered cardiac CT scan for calcium scoring -the patient will let me know when ready

## 2022-10-06 NOTE — Assessment & Plan Note (Signed)
On B12 

## 2022-10-06 NOTE — Progress Notes (Signed)
Subjective:  Patient ID: Amber Lowe, female    DOB: [birth year]  Age: 62 y.o. MRN: [id number]  CC: Well Adult Exam    HPI Amber Lowe presents for a well exam  Outpatient Medications Prior to Visit  Medication Sig Dispense Refill   b complex vitamins capsule Take 1 capsule by mouth daily. 100 capsule 3   Cholecalciferol (VITAMIN D3) 50 MCG (2000 UT) capsule Take 2 capsules (4,000 Units total) by mouth daily. 100 capsule 3   meloxicam (MOBIC) 15 MG tablet TAKE 1 TABLET BY MOUTH EVERY DAY 30 tablet 1   triamcinolone cream (KENALOG) 0.5 % Apply 1 application topically 3 (three) times daily.     valACYclovir (VALTREX) 1000 MG tablet TAKE 1 TABLET BY MOUTH TWICE A DAY 60 tablet 2   azithromycin (ZITHROMAX Z-PAK) 250 MG tablet As directed 6 tablet 0   promethazine (PHENERGAN) 25 MG tablet TAKE 1 TABLET BY MOUTH EVERY 6 HOURS AS NEEDED. 60 tablet 2   zolpidem (AMBIEN) 5 MG tablet TAKE 1 TABLET BY MOUTH EVERY DAY AT BEDTIME AS NEEDED 90 tablet 1   No facility-administered medications prior to visit.    ROS: Review of Systems  Constitutional:  Positive for fatigue. Negative for activity change, appetite change, chills and unexpected weight change.  HENT:  Negative for congestion, mouth sores and sinus pressure.   Eyes:  Negative for visual disturbance.  Respiratory:  Negative for cough and chest tightness.   Gastrointestinal:  Negative for abdominal pain and nausea.  Genitourinary:  Negative for difficulty urinating, frequency and vaginal pain.  Musculoskeletal:  Negative for back pain and gait problem.  Skin:  Negative for pallor and rash.  Neurological:  Negative for dizziness, tremors, weakness, numbness and headaches.  Psychiatric/Behavioral:  Positive for sleep disturbance. Negative for confusion and suicidal ideas. The patient is not nervous/anxious.     Objective:  BP (!) 142/96 (BP Location: Left Arm, Patient Position: Sitting, Cuff Size: Normal)   Pulse 64   Temp 97.7 F  (36.5 C) (Oral)   Ht 5' 1.5" (1.562 m)   Wt 124 lb (56.2 kg)   LMP 06/19/2012   SpO2 100%   BMI 23.05 kg/m   BP Readings from Last 3 Encounters:  10/06/22 (!) 142/96  09/16/21 110/72  07/26/20 132/80    Wt Readings from Last 3 Encounters:  10/06/22 124 lb (56.2 kg)  09/16/21 123 lb 3.2 oz (55.9 kg)  07/26/20 129 lb 6.4 oz (58.7 kg)    Physical Exam Constitutional:      General: She is not in acute distress.    Appearance: Normal appearance. She is well-developed.  HENT:     Head: Normocephalic.     Right Ear: External ear normal.     Left Ear: External ear normal.     Nose: Nose normal.  Eyes:     General:        Right eye: No discharge.        Left eye: No discharge.     Conjunctiva/sclera: Conjunctivae normal.     Pupils: Pupils are equal, round, and reactive to light.  Neck:     Thyroid: No thyromegaly.     Vascular: No JVD.     Trachea: No tracheal deviation.  Cardiovascular:     Rate and Rhythm: Normal rate and regular rhythm.     Heart sounds: Normal heart sounds.  Pulmonary:     Effort: No respiratory distress.     Breath sounds: No  stridor. No wheezing.  Abdominal:     General: Bowel sounds are normal. There is no distension.     Palpations: Abdomen is soft. There is no mass.     Tenderness: There is no abdominal tenderness. There is no guarding or rebound.  Musculoskeletal:        General: No tenderness.     Cervical back: Normal range of motion and neck supple. No rigidity.  Lymphadenopathy:     Cervical: No cervical adenopathy.  Skin:    Findings: No erythema or rash.  Neurological:     Mental Status: She is oriented to person, place, and time.     Cranial Nerves: No cranial nerve deficit.     Motor: No abnormal muscle tone.     Coordination: Coordination normal.     Deep Tendon Reflexes: Reflexes normal.  Psychiatric:        Behavior: Behavior normal.        Thought Content: Thought content normal.        Judgment: Judgment normal.      Lab Results  Component Value Date   WBC 4.9 09/16/2021   HGB 14.6 09/16/2021   HCT 42.8 09/16/2021   PLT 228.0 09/16/2021   GLUCOSE 90 09/16/2021   CHOL 201 (H) 09/16/2021   TRIG 117.0 09/16/2021   HDL 69.70 09/16/2021   LDLDIRECT 138.0 02/01/2018   LDLCALC 108 (H) 09/16/2021   ALT 19 09/16/2021   AST 18 09/16/2021   NA 139 09/16/2021   K 3.7 09/16/2021   CL 103 09/16/2021   CREATININE 0.87 09/16/2021   BUN 17 09/16/2021   CO2 28 09/16/2021   TSH 2.58 09/16/2021    MM 3D SCREENING MAMMOGRAM BILATERAL BREAST  Result Date: 10/02/2022 CLINICAL DATA:  Screening. EXAM: DIGITAL SCREENING BILATERAL MAMMOGRAM WITH TOMOSYNTHESIS AND CAD TECHNIQUE: Bilateral screening digital craniocaudal and mediolateral oblique mammograms were obtained. Bilateral screening digital breast tomosynthesis was performed. The images were evaluated with computer-aided detection. COMPARISON:  Previous exam(s). ACR Breast Density Category b: There are scattered areas of fibroglandular density. FINDINGS: There are no findings suspicious for malignancy. IMPRESSION: No mammographic evidence of malignancy. A result letter of this screening mammogram will be mailed directly to the patient. RECOMMENDATION: Screening mammogram in one year. (Code:SM-B-01Y) BI-RADS CATEGORY  1: Negative. Electronically Signed   By: Hulan Saas M.D.   On: 10/02/2022 10:24    Assessment & Plan:   Problem List Items Addressed This Visit     Vitamin B 12 deficiency    On B12      Relevant Orders   Vitamin B12   Vitamin D deficiency    On Vit D      Relevant Orders   VITAMIN D 25 Hydroxy (Vit-D Deficiency, Fractures)   Well adult exam - Primary     We discussed age appropriate health related issues, including available/recomended screening tests and vaccinations. Labs were ordered to be later reviewed . All questions were answered. We discussed one or more of the following - seat belt use, use of sunscreen/sun exposure  exercise,  fall risk reduction, second hand smoke exposure, firearm use and storage, seat belt use, a need for adhering to healthy diet and exercise. Labs were ordered.  All questions were answered. GYN - Dr Audie Box Colon - Dr Loreta Ave Declined all shots Offered cardiac CT scan for calcium scoring       Relevant Orders   TSH   Urinalysis   CBC with Differential/Platelet   Lipid panel  Comprehensive metabolic panel   Vitamin B12   VITAMIN D 25 Hydroxy (Vit-D Deficiency, Fractures)   Other Visit Diagnoses     Need for immunization against influenza       Relevant Orders   Flu vaccine trivalent PF, 6mos and older(Flulaval,Afluria,Fluarix,Fluzone)         Meds ordered this encounter  Medications   azithromycin (ZITHROMAX Z-PAK) 250 MG tablet    Sig: As directed    Dispense:  6 tablet    Refill:  0   promethazine (PHENERGAN) 25 MG tablet    Sig: Take 1 tablet (25 mg total) by mouth every 6 (six) hours as needed.    Dispense:  60 tablet    Refill:  2   zolpidem (AMBIEN) 5 MG tablet    Sig: TAKE 1 TABLET BY MOUTH EVERY DAY AT BEDTIME AS NEEDED    Dispense:  90 tablet    Refill:  1    This request is for a new prescription for a controlled substance as required by Federal/State law.      Follow-up: Return in about 1 year (around 10/06/2023) for a follow-up visit.  Sonda Primes, MD

## 2022-10-06 NOTE — Assessment & Plan Note (Signed)
On Vit D 

## 2022-10-10 ENCOUNTER — Other Ambulatory Visit: Payer: Self-pay | Admitting: Internal Medicine

## 2022-10-10 DIAGNOSIS — Z1211 Encounter for screening for malignant neoplasm of colon: Secondary | ICD-10-CM

## 2022-10-10 DIAGNOSIS — Z1212 Encounter for screening for malignant neoplasm of rectum: Secondary | ICD-10-CM

## 2022-10-25 LAB — COLOGUARD: COLOGUARD: NEGATIVE

## 2023-01-28 ENCOUNTER — Encounter: Payer: Self-pay | Admitting: Internal Medicine

## 2023-02-01 ENCOUNTER — Other Ambulatory Visit: Payer: Self-pay | Admitting: Internal Medicine

## 2023-02-01 DIAGNOSIS — Z780 Asymptomatic menopausal state: Secondary | ICD-10-CM

## 2023-02-01 DIAGNOSIS — E785 Hyperlipidemia, unspecified: Secondary | ICD-10-CM

## 2023-02-11 ENCOUNTER — Ambulatory Visit
Admission: RE | Admit: 2023-02-11 | Discharge: 2023-02-11 | Discharge status: Home / Self Care | Payer: 59 | Source: Ambulatory Visit | Attending: Internal Medicine | Admitting: Internal Medicine

## 2023-02-11 DIAGNOSIS — Z780 Asymptomatic menopausal state: Secondary | ICD-10-CM | POA: Diagnosis not present

## 2023-02-18 ENCOUNTER — Encounter: Payer: Self-pay | Admitting: Internal Medicine

## 2023-02-24 ENCOUNTER — Ambulatory Visit
Admission: RE | Admit: 2023-02-24 | Discharge: 2023-02-24 | Disposition: A | Discharge status: Home / Self Care | Payer: No Typology Code available for payment source | Source: Ambulatory Visit | Attending: Internal Medicine | Admitting: Internal Medicine

## 2023-02-24 DIAGNOSIS — E785 Hyperlipidemia, unspecified: Secondary | ICD-10-CM

## 2023-02-25 ENCOUNTER — Encounter: Payer: Self-pay | Admitting: Internal Medicine

## 2023-02-25 DIAGNOSIS — I2510 Atherosclerotic heart disease of native coronary artery without angina pectoris: Secondary | ICD-10-CM | POA: Insufficient documentation

## 2023-03-04 ENCOUNTER — Ambulatory Visit: Payer: 59 | Admitting: Internal Medicine

## 2023-03-04 ENCOUNTER — Encounter: Payer: Self-pay | Admitting: Internal Medicine

## 2023-03-04 VITALS — BP 110/70 | HR 81 | Temp 98.1°F | Ht 61.5 in | Wt 124.0 lb

## 2023-03-04 DIAGNOSIS — M8588 Other specified disorders of bone density and structure, other site: Secondary | ICD-10-CM

## 2023-03-04 DIAGNOSIS — M8580 Other specified disorders of bone density and structure, unspecified site: Secondary | ICD-10-CM | POA: Insufficient documentation

## 2023-03-04 DIAGNOSIS — E538 Deficiency of other specified B group vitamins: Secondary | ICD-10-CM

## 2023-03-04 DIAGNOSIS — E559 Vitamin D deficiency, unspecified: Secondary | ICD-10-CM | POA: Diagnosis not present

## 2023-03-04 MED ORDER — HYDROCODONE-ACETAMINOPHEN 5-325 MG PO TABS
1.0000 | ORAL_TABLET | Freq: Four times a day (QID) | ORAL | 0 refills | Status: DC | PRN
Start: 2023-03-04 — End: 2023-11-08

## 2023-03-04 MED ORDER — PROMETHAZINE HCL 25 MG PO TABS: 25.0000 mg | ORAL_TABLET | Freq: Four times a day (QID) | ORAL | 2 refills | Status: DC | PRN

## 2023-03-04 NOTE — Progress Notes (Signed)
 Subjective:  Patient ID: Amber Lowe, female    DOB: 02/17/1960  Age: 63 y.o. MRN: [id number]  CC: No chief complaint on file.   HPI Amber Lowe presents for severe LBP x chronic/recurrent Last episode in Nov 2024- longer than usual, LS X ray with OA  Outpatient Medications Prior to Visit  Medication Sig Dispense Refill   b complex vitamins capsule Take 1 capsule by mouth daily. 100 capsule 3   Cholecalciferol (VITAMIN D3) 50 MCG (2000 UT) capsule Take 2 capsules (4,000 Units total) by mouth daily. 100 capsule 3   meloxicam (MOBIC) 15 MG tablet TAKE 1 TABLET BY MOUTH EVERY DAY 30 tablet 1   triamcinolone cream (KENALOG) 0.5 % Apply 1 application topically 3 (three) times daily.     valACYclovir (VALTREX) 1000 MG tablet TAKE 1 TABLET BY MOUTH TWICE A DAY 60 tablet 2   zolpidem (AMBIEN) 5 MG tablet TAKE 1 TABLET BY MOUTH EVERY DAY AT BEDTIME AS NEEDED 90 tablet 1   promethazine (PHENERGAN) 25 MG tablet Take 1 tablet (25 mg total) by mouth every 6 (six) hours as needed. 60 tablet 2   azithromycin (ZITHROMAX Z-PAK) 250 MG tablet As directed 6 tablet 0   No facility-administered medications prior to visit.    ROS: Review of Systems  Constitutional:  Negative for activity change, appetite change, chills, fatigue and unexpected weight change.  HENT:  Negative for congestion, mouth sores and sinus pressure.   Eyes:  Negative for visual disturbance.  Respiratory:  Negative for cough and chest tightness.   Gastrointestinal:  Negative for abdominal pain and nausea.  Genitourinary:  Negative for difficulty urinating, frequency and vaginal pain.  Musculoskeletal:  Positive for arthralgias and back pain. Negative for gait problem.  Skin:  Negative for pallor and rash.  Neurological:  Negative for dizziness, tremors, weakness, numbness and headaches.  Psychiatric/Behavioral:  Negative for confusion, sleep disturbance and suicidal ideas. The patient is not nervous/anxious.     Objective:   BP 110/70   Pulse 81   Temp 98.1 F (36.7 C) (Oral)   Ht 5' 1.5" (1.562 m)   Wt 124 lb (56.2 kg)   LMP 06/19/2012   SpO2 99%   BMI 23.05 kg/m   BP Readings from Last 3 Encounters:  03/04/23 110/70  10/06/22 (!) 142/96  09/16/21 110/72    Wt Readings from Last 3 Encounters:  03/04/23 124 lb (56.2 kg)  10/06/22 124 lb (56.2 kg)  09/16/21 123 lb 3.2 oz (55.9 kg)    Physical Exam Constitutional:      Appearance: She is obese.     Lab Results  Component Value Date   WBC 3.3 (L) 10/06/2022   HGB 14.8 10/06/2022   HCT 43.9 10/06/2022   PLT 239.0 10/06/2022   GLUCOSE 96 10/06/2022   CHOL 194 10/06/2022   TRIG 97.0 10/06/2022   HDL 69.60 10/06/2022   LDLDIRECT 138.0 02/01/2018   LDLCALC 105 (H) 10/06/2022   ALT 19 10/06/2022   AST 19 10/06/2022   NA 141 10/06/2022   K 3.9 10/06/2022   CL 105 10/06/2022   CREATININE 0.75 10/06/2022   BUN 13 10/06/2022   CO2 27 10/06/2022   TSH 1.72 10/06/2022    CT CARDIAC SCORING ([facility] LOCATIONS ONLY) Result Date: 02/25/2023 CLINICAL DATA:  63 year old Asian female with history of dyslipidemia. Evaluate for coronary artery disease. * Tracking Code: FCC * EXAM: CT CARDIAC CORONARY ARTERY CALCIUM SCORE TECHNIQUE: Non-contrast imaging through the heart was  performed using prospective ECG gating. Image post processing was performed on an independent workstation, allowing for quantitative analysis of the heart and coronary arteries. Note that this exam targets the heart and the chest was not imaged in its entirety. COMPARISON:  No priors. FINDINGS: CORONARY CALCIUM SCORES: Left Main: 0 LAD: 0 LCx: 0 RCA/PDA: 15 Total Agatston Score: 15 MESA database percentile: 66th AORTA MEASUREMENTS: Ascending Aorta: 3.4 cm Descending Aorta:2.3 cm OTHER FINDINGS: Atherosclerotic calcifications are noted in the thoracic aorta. Within the visualized portions of the thorax there are no suspicious appearing pulmonary nodules or masses, there is no acute  consolidative airspace disease, no pleural effusions, no pneumothorax and no lymphadenopathy. Visualized portions of the upper abdomen are unremarkable. There are no aggressive appearing lytic or blastic lesions noted in the visualized portions of the skeleton. IMPRESSION: 1. Patient's total coronary artery calcium score is 15 which is 66th percentile for patient's of matched age, gender and race/ethnicity. Please note that although the presence of coronary artery calcium documents the presence of coronary artery disease, the severity of this disease and any potential stenosis cannot be assessed on this noncontrast CT examination. Assessment for potential risk factor modification, dietary therapy or pharmacologic therapy may be warranted, if clinically indicated. 2.  Aortic Atherosclerosis (ICD10-I70.0). Electronically Signed   By: Trudie Reed M.D.   On: 02/25/2023 08:07    Assessment & Plan:   Problem List Items Addressed This Visit     Vitamin B 12 deficiency - Primary   On B12      Vitamin D deficiency   On Vit D+K2      Osteopenia   Vit D and K2 Calcium rich foods Start yoga         Meds ordered this encounter  Medications   promethazine (PHENERGAN) 25 MG tablet    Sig: Take 1 tablet (25 mg total) by mouth every 6 (six) hours as needed.    Dispense:  60 tablet    Refill:  2   HYDROcodone-acetaminophen (NORCO/VICODIN) 5-325 MG tablet    Sig: Take 1 tablet by mouth every 6 (six) hours as needed for severe pain (pain score 7-10).    Dispense:  20 tablet    Refill:  0      Follow-up: Return in about 3 months (around 06/01/2023) for a follow-up visit.  Sonda Primes, MD

## 2023-03-04 NOTE — Assessment & Plan Note (Signed)
 On B12

## 2023-03-04 NOTE — Assessment & Plan Note (Signed)
 severe LBP x chronic/recurrent Last episode in Nov 2024- longer than usual, LS X ray with OA Norco, Meloxicam prn

## 2023-03-04 NOTE — Assessment & Plan Note (Signed)
 Vit D and K2 Calcium rich foods Start yoga

## 2023-03-04 NOTE — Assessment & Plan Note (Addendum)
 On Vit D+K2

## 2023-03-04 NOTE — Patient Instructions (Signed)

## 2023-05-08 ENCOUNTER — Other Ambulatory Visit: Payer: Self-pay | Admitting: Internal Medicine

## 2023-07-24 ENCOUNTER — Encounter: Payer: Self-pay | Admitting: Advanced Practice Midwife

## 2023-09-24 ENCOUNTER — Other Ambulatory Visit: Payer: Self-pay | Admitting: Internal Medicine

## 2023-09-24 DIAGNOSIS — Z1231 Encounter for screening mammogram for malignant neoplasm of breast: Secondary | ICD-10-CM

## 2023-10-12 ENCOUNTER — Encounter: Payer: Self-pay | Admitting: Internal Medicine

## 2023-10-12 ENCOUNTER — Ambulatory Visit: Admitting: Internal Medicine

## 2023-10-12 VITALS — BP 116/82 | HR 63 | Temp 98.1°F | Ht 61.5 in | Wt 111.0 lb

## 2023-10-12 DIAGNOSIS — E538 Deficiency of other specified B group vitamins: Secondary | ICD-10-CM

## 2023-10-12 DIAGNOSIS — Z23 Encounter for immunization: Secondary | ICD-10-CM | POA: Diagnosis not present

## 2023-10-12 DIAGNOSIS — Z0000 Encounter for general adult medical examination without abnormal findings: Secondary | ICD-10-CM | POA: Diagnosis not present

## 2023-10-12 DIAGNOSIS — F411 Generalized anxiety disorder: Secondary | ICD-10-CM

## 2023-10-12 LAB — URINALYSIS, ROUTINE W REFLEX MICROSCOPIC
Bilirubin Urine: NEGATIVE
Ketones, ur: NEGATIVE
Nitrite: NEGATIVE
Specific Gravity, Urine: <=1.005 — AB (ref 1.000–1.030)
Total Protein, Urine: NEGATIVE
Urine Glucose: NEGATIVE
Urobilinogen, UA: 0.2 (ref 0.0–1.0)
pH: 6.5 (ref 5.0–8.0)

## 2023-10-12 LAB — CBC WITH DIFFERENTIAL/PLATELET
Basophils Absolute: 0.1 K/uL (ref 0.0–0.1)
Basophils Relative: 1.2 % (ref 0.0–3.0)
Eosinophils Absolute: 0.2 K/uL (ref 0.0–0.7)
Eosinophils Relative: 4.3 % (ref 0.0–5.0)
HCT: 42.6 % (ref 36.0–46.0)
Hemoglobin: 14.3 g/dL (ref 12.0–15.0)
Lymphocytes Relative: 47.2 % — ABNORMAL HIGH (ref 12.0–46.0)
Lymphs Abs: 2.3 K/uL (ref 0.7–4.0)
MCHC: 33.5 g/dL (ref 30.0–36.0)
MCV: 94.6 fl (ref 78.0–100.0)
Monocytes Absolute: 0.4 K/uL (ref 0.1–1.0)
Monocytes Relative: 8.2 % (ref 3.0–12.0)
Neutro Abs: 1.9 K/uL (ref 1.4–7.7)
Neutrophils Relative %: 39.1 % — ABNORMAL LOW (ref 43.0–77.0)
Platelets: 264 K/uL (ref 150.0–400.0)
RBC: 4.5 Mil/uL (ref 3.87–5.11)
RDW: 13.6 % (ref 11.5–15.5)
WBC: 4.9 K/uL (ref 4.0–10.5)

## 2023-10-12 LAB — TSH: TSH: 2.51 u[IU]/mL (ref 0.35–5.50)

## 2023-10-12 MED ORDER — NITROFURANTOIN MONOHYD MACRO 100 MG PO CAPS: 100.0000 mg | ORAL_CAPSULE | Freq: Two times a day (BID) | ORAL | 0 refills | Status: AC

## 2023-10-12 MED ORDER — ZOLPIDEM TARTRATE 5 MG PO TABS: ORAL_TABLET | ORAL | 1 refills | Status: AC

## 2023-10-12 MED ORDER — PROMETHAZINE HCL 25 MG PO TABS: 25.0000 mg | ORAL_TABLET | Freq: Four times a day (QID) | ORAL | 2 refills | Status: AC | PRN

## 2023-10-12 MED ORDER — MELOXICAM 15 MG PO TABS: 15.0000 mg | ORAL_TABLET | Freq: Every day | ORAL | 3 refills | Status: AC

## 2023-10-12 MED ORDER — AZITHROMYCIN 250 MG PO TABS: ORAL_TABLET | ORAL | 0 refills | Status: AC

## 2023-10-12 MED ORDER — VALACYCLOVIR HCL 1 G PO TABS: 1000.0000 mg | ORAL_TABLET | Freq: Two times a day (BID) | ORAL | 2 refills | Status: AC

## 2023-10-12 NOTE — Assessment & Plan Note (Signed)
  We discussed age appropriate health related issues, including available/recomended screening tests and vaccinations. Labs were ordered to be later reviewed . All questions were answered. We discussed one or more of the following - seat belt use, use of sunscreen/sun exposure exercise,  fall risk reduction, second hand smoke exposure, firearm use and storage, seat belt use, a need for adhering to healthy diet and exercise. Labs were ordered.  All questions were answered. GYN - Dr Rockney retired Colon - Dr Kristie Declined all shots Offered cardiac CT scan for calcium scoring -the patient will let me know when ready Sch eye exam

## 2023-10-12 NOTE — Progress Notes (Unsigned)
 Subjective:  Patient ID: Amber Lowe, female    DOB: 1960-06-23  Age: 63 y.o. MRN: [id number]  CC: Annual Exam   HPI Amber Lowe presents for insomnia, stress, H simplex Pt lost wt on diet Stress Well exam  Outpatient Medications Prior to Visit  Medication Sig Dispense Refill   b complex vitamins capsule Take 1 capsule by mouth daily. 100 capsule 3   Cholecalciferol (VITAMIN D3) 50 MCG (2000 UT) capsule Take 2 capsules (4,000 Units total) by mouth daily. 100 capsule 3   HYDROcodone -acetaminophen  (NORCO/VICODIN) 5-325 MG tablet Take 1 tablet by mouth every 6 (six) hours as needed for severe pain (pain score 7-10). 20 tablet 0   triamcinolone  cream (KENALOG ) 0.5 % Apply 1 application topically 3 (three) times daily.     meloxicam  (MOBIC ) 15 MG tablet TAKE 1 TABLET BY MOUTH EVERY DAY 30 tablet 1   promethazine  (PHENERGAN ) 25 MG tablet Take 1 tablet (25 mg total) by mouth every 6 (six) hours as needed. 60 tablet 2   valACYclovir  (VALTREX ) 1000 MG tablet TAKE 1 TABLET BY MOUTH TWICE A DAY 60 tablet 2   zolpidem  (AMBIEN ) 5 MG tablet TAKE 1 TABLET BY MOUTH EVERY DAY AT BEDTIME AS NEEDED 90 tablet 1   No facility-administered medications prior to visit.    ROS: Review of Systems  Constitutional:  Positive for unexpected weight change. Negative for activity change, appetite change, chills and fatigue.  HENT:  Negative for congestion, mouth sores and sinus pressure.   Eyes:  Negative for visual disturbance.  Respiratory:  Negative for cough and chest tightness.   Gastrointestinal:  Negative for abdominal pain and nausea.  Genitourinary:  Negative for difficulty urinating, frequency and vaginal pain.  Musculoskeletal:  Positive for arthralgias and back pain. Negative for gait problem.  Skin:  Negative for pallor and rash.  Neurological:  Negative for dizziness, tremors, weakness, numbness and headaches.  Psychiatric/Behavioral:  Positive for sleep disturbance. Negative for confusion.  The patient is nervous/anxious.     Objective:  BP 116/82 (BP Location: Left Arm, Patient Position: Sitting, Cuff Size: Normal)   Pulse 63   Temp 98.1 F (36.7 C) (Oral)   Ht 5' 1.5 (1.562 m)   Wt 111 lb (50.3 kg)   LMP 06/19/2012   BMI 20.63 kg/m   BP Readings from Last 3 Encounters:  10/12/23 116/82  03/04/23 110/70  10/06/22 (!) 142/96    Wt Readings from Last 3 Encounters:  10/12/23 111 lb (50.3 kg)  03/04/23 124 lb (56.2 kg)  10/06/22 124 lb (56.2 kg)    Physical Exam Constitutional:      General: She is not in acute distress.    Appearance: Normal appearance. She is well-developed.  HENT:     Head: Normocephalic.     Right Ear: External ear normal.     Left Ear: External ear normal.     Nose: Nose normal.  Eyes:     General:        Right eye: No discharge.        Left eye: No discharge.     Conjunctiva/sclera: Conjunctivae normal.     Pupils: Pupils are equal, round, and reactive to light.  Neck:     Thyroid : No thyromegaly.     Vascular: No JVD.     Trachea: No tracheal deviation.  Cardiovascular:     Rate and Rhythm: Normal rate and regular rhythm.     Heart sounds: Normal heart sounds.  Pulmonary:  Effort: No respiratory distress.     Breath sounds: No stridor. No wheezing.  Abdominal:     General: Bowel sounds are normal. There is no distension.     Palpations: Abdomen is soft. There is no mass.     Tenderness: There is no abdominal tenderness. There is no guarding or rebound.  Musculoskeletal:        General: No tenderness.     Cervical back: Normal range of motion and neck supple. No rigidity.  Lymphadenopathy:     Cervical: No cervical adenopathy.  Skin:    Findings: No erythema or rash.  Neurological:     Cranial Nerves: No cranial nerve deficit.     Motor: No abnormal muscle tone.     Coordination: Coordination normal.     Deep Tendon Reflexes: Reflexes normal.  Psychiatric:        Behavior: Behavior normal.        Thought  Content: Thought content normal.        Judgment: Judgment normal.     Lab Results  Component Value Date   WBC 3.3 (L) 10/06/2022   HGB 14.8 10/06/2022   HCT 43.9 10/06/2022   PLT 239.0 10/06/2022   GLUCOSE 96 10/06/2022   CHOL 194 10/06/2022   TRIG 97.0 10/06/2022   HDL 69.60 10/06/2022   LDLDIRECT 138.0 02/01/2018   LDLCALC 105 (H) 10/06/2022   ALT 19 10/06/2022   AST 19 10/06/2022   NA 141 10/06/2022   K 3.9 10/06/2022   CL 105 10/06/2022   CREATININE 0.75 10/06/2022   BUN 13 10/06/2022   CO2 27 10/06/2022   TSH 1.72 10/06/2022    CT CARDIAC SCORING ([facility] LOCATIONS ONLY) Result Date: 02/25/2023 CLINICAL DATA:  63 year old Asian female with history of dyslipidemia. Evaluate for coronary artery disease. * Tracking Code: FCC * EXAM: CT CARDIAC CORONARY ARTERY CALCIUM SCORE TECHNIQUE: Non-contrast imaging through the heart was performed using prospective ECG gating. Image post processing was performed on an independent workstation, allowing for quantitative analysis of the heart and coronary arteries. Note that this exam targets the heart and the chest was not imaged in its entirety. COMPARISON:  No priors. FINDINGS: CORONARY CALCIUM SCORES: Left Main: 0 LAD: 0 LCx: 0 RCA/PDA: 15 Total Agatston Score: 15 MESA database percentile: 66th AORTA MEASUREMENTS: Ascending Aorta: 3.4 cm Descending Aorta:2.3 cm OTHER FINDINGS: Atherosclerotic calcifications are noted in the thoracic aorta. Within the visualized portions of the thorax there are no suspicious appearing pulmonary nodules or masses, there is no acute consolidative airspace disease, no pleural effusions, no pneumothorax and no lymphadenopathy. Visualized portions of the upper abdomen are unremarkable. There are no aggressive appearing lytic or blastic lesions noted in the visualized portions of the skeleton. IMPRESSION: 1. Patient's total coronary artery calcium score is 15 which is 66th percentile for patient's of matched age, gender  and race/ethnicity. Please note that although the presence of coronary artery calcium documents the presence of coronary artery disease, the severity of this disease and any potential stenosis cannot be assessed on this noncontrast CT examination. Assessment for potential risk factor modification, dietary therapy or pharmacologic therapy may be warranted, if clinically indicated. 2.  Aortic Atherosclerosis (ICD10-I70.0). Electronically Signed   By: Toribio Aye M.D.   On: 02/25/2023 08:07    Assessment & Plan:   Problem List Items Addressed This Visit     Anxiety disorder   Off meds       Well adult exam - Primary  We discussed age appropriate health related issues, including available/recomended screening tests and vaccinations. Labs were ordered to be later reviewed . All questions were answered. We discussed one or more of the following - seat belt use, use of sunscreen/sun exposure exercise,  fall risk reduction, second hand smoke exposure, firearm use and storage, seat belt use, a need for adhering to healthy diet and exercise. Labs were ordered.  All questions were answered. GYN - Dr Rockney retired Colon - Dr Kristie Declined all shots Offered cardiac CT scan for calcium scoring -the patient will let me know when ready Sch eye exam      Relevant Orders   TSH   Urinalysis   CBC with Differential/Platelet   Lipid panel   Comprehensive metabolic panel with GFR   Other Visit Diagnoses       Flu vaccine need       Relevant Orders   Flu vaccine trivalent PF, 6mos and older(Flulaval,Afluria,Fluarix,Fluzone) (Completed)         Meds ordered this encounter  Medications   meloxicam  (MOBIC ) 15 MG tablet    Sig: Take 1 tablet (15 mg total) by mouth daily.    Dispense:  90 tablet    Refill:  3   promethazine  (PHENERGAN ) 25 MG tablet    Sig: Take 1 tablet (25 mg total) by mouth every 6 (six) hours as needed.    Dispense:  60 tablet    Refill:  2   valACYclovir  (VALTREX )  1000 MG tablet    Sig: Take 1 tablet (1,000 mg total) by mouth 2 (two) times daily.    Dispense:  60 tablet    Refill:  2   zolpidem  (AMBIEN ) 5 MG tablet    Sig: TAKE 1 TABLET BY MOUTH EVERY DAY AT BEDTIME AS NEEDED    Dispense:  90 tablet    Refill:  1    This request is for a new prescription for a controlled substance as required by Federal/State law.   nitrofurantoin , macrocrystal-monohydrate, (MACROBID ) 100 MG capsule    Sig: Take 1 capsule (100 mg total) by mouth 2 (two) times daily for 7 days.    Dispense:  14 capsule    Refill:  0   azithromycin  (ZITHROMAX  Z-PAK) 250 MG tablet    Sig: As directed    Dispense:  6 tablet    Refill:  0      Follow-up: Return in about 6 months (around 04/11/2024) for a follow-up visit.  Marolyn Noel, MD

## 2023-10-12 NOTE — Assessment & Plan Note (Signed)
 Off meds

## 2023-10-13 LAB — COMPREHENSIVE METABOLIC PANEL WITH GFR
ALT: 19 U/L (ref 0–35)
AST: 20 U/L (ref 0–37)
Albumin: 4.7 g/dL (ref 3.5–5.2)
Alkaline Phosphatase: 44 U/L (ref 39–117)
BUN: 8 mg/dL (ref 6–23)
CO2: 26 meq/L (ref 19–32)
Calcium: 9.6 mg/dL (ref 8.4–10.5)
Chloride: 101 meq/L (ref 96–112)
Creatinine, Ser: 0.57 mg/dL (ref 0.40–1.20)
GFR: 95.71 mL/min (ref >60.00)
Glucose, Bld: 102 mg/dL — ABNORMAL HIGH (ref 70–99)
Potassium: 3.8 meq/L (ref 3.5–5.1)
Sodium: 139 meq/L (ref 135–145)
Total Bilirubin: 2 mg/dL — ABNORMAL HIGH (ref 0.2–1.2)
Total Protein: 7.5 g/dL (ref 6.0–8.3)

## 2023-10-13 LAB — LIPID PANEL
Cholesterol: 200 mg/dL (ref 0–200)
HDL: 72.9 mg/dL (ref >39.00)
LDL Cholesterol: 111 mg/dL — ABNORMAL HIGH (ref 0–99)
NonHDL: 126.76
Total CHOL/HDL Ratio: 3
Triglycerides: 80 mg/dL (ref 0.0–149.0)
VLDL: 16 mg/dL (ref 0.0–40.0)

## 2023-10-13 NOTE — Assessment & Plan Note (Signed)
On vitamin B12

## 2023-10-14 ENCOUNTER — Ambulatory Visit
Admission: RE | Admit: 2023-10-14 | Discharge: 2023-10-14 | Disposition: A | Discharge status: Home / Self Care | Source: Ambulatory Visit | Attending: Internal Medicine | Admitting: Internal Medicine

## 2023-10-14 DIAGNOSIS — Z1231 Encounter for screening mammogram for malignant neoplasm of breast: Secondary | ICD-10-CM

## 2023-10-19 ENCOUNTER — Ambulatory Visit: Payer: Self-pay | Admitting: Internal Medicine

## 2023-10-19 ENCOUNTER — Encounter: Payer: Self-pay | Admitting: Internal Medicine

## 2023-10-20 ENCOUNTER — Encounter: Admitting: Internal Medicine

## 2023-10-20 ENCOUNTER — Other Ambulatory Visit: Payer: Self-pay | Admitting: Internal Medicine

## 2023-10-20 MED ORDER — NITROFURANTOIN MONOHYD MACRO 100 MG PO CAPS: 100.0000 mg | ORAL_CAPSULE | Freq: Two times a day (BID) | ORAL | 2 refills | Status: AC

## 2023-10-30 ENCOUNTER — Encounter: Payer: Self-pay | Admitting: Internal Medicine

## 2023-11-08 ENCOUNTER — Other Ambulatory Visit: Payer: Self-pay | Admitting: Internal Medicine

## 2023-11-08 MED ORDER — HYDROCODONE-ACETAMINOPHEN 5-325 MG PO TABS: 1.0000 | ORAL_TABLET | Freq: Four times a day (QID) | ORAL | 0 refills | Status: AC | PRN
